# Patient Record
Sex: Male | Born: 1945 | Race: White | Hispanic: No | Marital: Married | Smoking: Former smoker
Health system: Southern US, Community
[De-identification: ages and names within clinical notes are randomized; demographics above are authoritative.]

## PROBLEM LIST (undated history)

## (undated) DIAGNOSIS — N400 Enlarged prostate without lower urinary tract symptoms: Secondary | ICD-10-CM

## (undated) DIAGNOSIS — I10 Essential (primary) hypertension: Secondary | ICD-10-CM

## (undated) DIAGNOSIS — E119 Type 2 diabetes mellitus without complications: Secondary | ICD-10-CM

## (undated) DIAGNOSIS — M1990 Unspecified osteoarthritis, unspecified site: Secondary | ICD-10-CM

## (undated) DIAGNOSIS — Z972 Presence of dental prosthetic device (complete) (partial): Secondary | ICD-10-CM

## (undated) HISTORY — PX: OTHER SURGICAL HISTORY: SHX169

## (undated) HISTORY — DX: Type 2 diabetes mellitus without complications: E11.9

## (undated) HISTORY — DX: Essential (primary) hypertension: I10

## (undated) HISTORY — PX: MOUTH SURGERY: SHX715

## (undated) HISTORY — PX: COLONOSCOPY: SHX174

---

## 2015-03-25 ENCOUNTER — Other Ambulatory Visit: Payer: Self-pay | Admitting: Infectious Diseases

## 2015-03-25 DIAGNOSIS — R109 Unspecified abdominal pain: Secondary | ICD-10-CM

## 2015-03-29 ENCOUNTER — Ambulatory Visit
Admission: RE | Admit: 2015-03-29 | Discharge: 2015-03-29 | Disposition: A | Discharge status: Home / Self Care | Payer: PPO | Source: Ambulatory Visit | Attending: Infectious Diseases | Admitting: Infectious Diseases

## 2015-03-29 DIAGNOSIS — R161 Splenomegaly, not elsewhere classified: Secondary | ICD-10-CM | POA: Insufficient documentation

## 2015-03-29 DIAGNOSIS — R109 Unspecified abdominal pain: Secondary | ICD-10-CM | POA: Diagnosis not present

## 2015-03-29 DIAGNOSIS — N400 Enlarged prostate without lower urinary tract symptoms: Secondary | ICD-10-CM | POA: Insufficient documentation

## 2015-03-29 DIAGNOSIS — R809 Proteinuria, unspecified: Secondary | ICD-10-CM | POA: Insufficient documentation

## 2015-10-27 DIAGNOSIS — E1165 Type 2 diabetes mellitus with hyperglycemia: Secondary | ICD-10-CM | POA: Diagnosis not present

## 2015-10-27 DIAGNOSIS — R972 Elevated prostate specific antigen [PSA]: Secondary | ICD-10-CM | POA: Diagnosis not present

## 2015-10-27 DIAGNOSIS — E1129 Type 2 diabetes mellitus with other diabetic kidney complication: Secondary | ICD-10-CM | POA: Diagnosis not present

## 2015-10-27 DIAGNOSIS — R809 Proteinuria, unspecified: Secondary | ICD-10-CM | POA: Diagnosis not present

## 2015-10-27 DIAGNOSIS — E7800 Pure hypercholesterolemia, unspecified: Secondary | ICD-10-CM | POA: Diagnosis not present

## 2015-10-27 DIAGNOSIS — R109 Unspecified abdominal pain: Secondary | ICD-10-CM | POA: Diagnosis not present

## 2015-11-03 DIAGNOSIS — R809 Proteinuria, unspecified: Secondary | ICD-10-CM | POA: Diagnosis not present

## 2015-11-03 DIAGNOSIS — E7800 Pure hypercholesterolemia, unspecified: Secondary | ICD-10-CM | POA: Diagnosis not present

## 2015-11-03 DIAGNOSIS — R972 Elevated prostate specific antigen [PSA]: Secondary | ICD-10-CM | POA: Diagnosis not present

## 2015-11-03 DIAGNOSIS — E1129 Type 2 diabetes mellitus with other diabetic kidney complication: Secondary | ICD-10-CM | POA: Diagnosis not present

## 2016-01-18 DIAGNOSIS — H35371 Puckering of macula, right eye: Secondary | ICD-10-CM | POA: Diagnosis not present

## 2016-03-02 DIAGNOSIS — R972 Elevated prostate specific antigen [PSA]: Secondary | ICD-10-CM | POA: Diagnosis not present

## 2016-03-02 DIAGNOSIS — E7800 Pure hypercholesterolemia, unspecified: Secondary | ICD-10-CM | POA: Diagnosis not present

## 2016-03-02 DIAGNOSIS — R809 Proteinuria, unspecified: Secondary | ICD-10-CM | POA: Diagnosis not present

## 2016-03-02 DIAGNOSIS — E1129 Type 2 diabetes mellitus with other diabetic kidney complication: Secondary | ICD-10-CM | POA: Diagnosis not present

## 2016-03-07 DIAGNOSIS — E7800 Pure hypercholesterolemia, unspecified: Secondary | ICD-10-CM | POA: Diagnosis not present

## 2016-03-07 DIAGNOSIS — R972 Elevated prostate specific antigen [PSA]: Secondary | ICD-10-CM | POA: Diagnosis not present

## 2016-03-07 DIAGNOSIS — R809 Proteinuria, unspecified: Secondary | ICD-10-CM | POA: Diagnosis not present

## 2016-03-07 DIAGNOSIS — E1129 Type 2 diabetes mellitus with other diabetic kidney complication: Secondary | ICD-10-CM | POA: Diagnosis not present

## 2016-06-28 DIAGNOSIS — R972 Elevated prostate specific antigen [PSA]: Secondary | ICD-10-CM | POA: Diagnosis not present

## 2016-06-28 DIAGNOSIS — R809 Proteinuria, unspecified: Secondary | ICD-10-CM | POA: Diagnosis not present

## 2016-06-28 DIAGNOSIS — E7800 Pure hypercholesterolemia, unspecified: Secondary | ICD-10-CM | POA: Diagnosis not present

## 2016-06-28 DIAGNOSIS — E1129 Type 2 diabetes mellitus with other diabetic kidney complication: Secondary | ICD-10-CM | POA: Diagnosis not present

## 2016-07-07 DIAGNOSIS — Z1159 Encounter for screening for other viral diseases: Secondary | ICD-10-CM | POA: Diagnosis not present

## 2016-07-07 DIAGNOSIS — E1129 Type 2 diabetes mellitus with other diabetic kidney complication: Secondary | ICD-10-CM | POA: Diagnosis not present

## 2016-07-07 DIAGNOSIS — R809 Proteinuria, unspecified: Secondary | ICD-10-CM | POA: Diagnosis not present

## 2016-07-07 DIAGNOSIS — Z0000 Encounter for general adult medical examination without abnormal findings: Secondary | ICD-10-CM | POA: Diagnosis not present

## 2016-07-07 DIAGNOSIS — E7800 Pure hypercholesterolemia, unspecified: Secondary | ICD-10-CM | POA: Diagnosis not present

## 2016-07-07 DIAGNOSIS — R972 Elevated prostate specific antigen [PSA]: Secondary | ICD-10-CM | POA: Diagnosis not present

## 2016-11-06 DIAGNOSIS — H35371 Puckering of macula, right eye: Secondary | ICD-10-CM | POA: Diagnosis not present

## 2016-12-25 DIAGNOSIS — R339 Retention of urine, unspecified: Secondary | ICD-10-CM | POA: Diagnosis not present

## 2016-12-25 DIAGNOSIS — R809 Proteinuria, unspecified: Secondary | ICD-10-CM | POA: Diagnosis not present

## 2016-12-25 DIAGNOSIS — E1129 Type 2 diabetes mellitus with other diabetic kidney complication: Secondary | ICD-10-CM | POA: Diagnosis not present

## 2016-12-25 DIAGNOSIS — R350 Frequency of micturition: Secondary | ICD-10-CM | POA: Diagnosis not present

## 2016-12-26 DIAGNOSIS — R350 Frequency of micturition: Secondary | ICD-10-CM | POA: Diagnosis not present

## 2016-12-27 ENCOUNTER — Encounter: Payer: Self-pay | Admitting: *Deleted

## 2016-12-27 ENCOUNTER — Emergency Department
Admission: EM | Admit: 2016-12-27 | Discharge: 2016-12-28 | Disposition: A | Discharge status: Home / Self Care | Payer: PPO | Attending: Emergency Medicine | Admitting: Emergency Medicine

## 2016-12-27 DIAGNOSIS — Z79899 Other long term (current) drug therapy: Secondary | ICD-10-CM | POA: Diagnosis not present

## 2016-12-27 DIAGNOSIS — Z7984 Long term (current) use of oral hypoglycemic drugs: Secondary | ICD-10-CM | POA: Insufficient documentation

## 2016-12-27 DIAGNOSIS — R339 Retention of urine, unspecified: Secondary | ICD-10-CM | POA: Insufficient documentation

## 2016-12-27 DIAGNOSIS — Z7982 Long term (current) use of aspirin: Secondary | ICD-10-CM | POA: Insufficient documentation

## 2016-12-27 LAB — CBC
HCT: 38.5 % — ABNORMAL LOW (ref 40.0–52.0)
HEMOGLOBIN: 13 g/dL (ref 13.0–18.0)
MCH: 26.1 pg (ref 26.0–34.0)
MCHC: 33.8 g/dL (ref 32.0–36.0)
MCV: 77.2 fL — ABNORMAL LOW (ref 80.0–100.0)
Platelets: 178 10*3/uL (ref 150–440)
RBC: 4.99 MIL/uL (ref 4.40–5.90)
RDW: 13.5 % (ref 11.5–14.5)
WBC: 7.7 10*3/uL (ref 3.8–10.6)

## 2016-12-27 LAB — COMPREHENSIVE METABOLIC PANEL
ALBUMIN: 4.4 g/dL (ref 3.5–5.0)
ALK PHOS: 70 U/L (ref 38–126)
ALT: 27 U/L (ref 17–63)
AST: 32 U/L (ref 15–41)
Anion gap: 8 (ref 5–15)
BUN: 20 mg/dL (ref 6–20)
CHLORIDE: 98 mmol/L — AB (ref 101–111)
CO2: 27 mmol/L (ref 22–32)
CREATININE: 0.94 mg/dL (ref 0.61–1.24)
Calcium: 9.6 mg/dL (ref 8.9–10.3)
GFR calc non Af Amer: >60 mL/min (ref >60)
GLUCOSE: 173 mg/dL — AB (ref 65–99)
Potassium: 3.7 mmol/L (ref 3.5–5.1)
SODIUM: 133 mmol/L — AB (ref 135–145)
Total Bilirubin: 1.8 mg/dL — ABNORMAL HIGH (ref 0.3–1.2)
Total Protein: 7.7 g/dL (ref 6.5–8.1)

## 2016-12-27 LAB — URINALYSIS, COMPLETE (UACMP) WITH MICROSCOPIC
BACTERIA UA: NONE SEEN
BILIRUBIN URINE: NEGATIVE
Glucose, UA: NEGATIVE mg/dL
KETONES UR: NEGATIVE mg/dL
Leukocytes, UA: NEGATIVE
Nitrite: NEGATIVE
PH: 5.5 (ref 5.0–8.0)
PROTEIN: NEGATIVE mg/dL
Specific Gravity, Urine: 1.015 (ref 1.005–1.030)
Squamous Epithelial / HPF: NONE SEEN

## 2016-12-27 MED ORDER — LIDOCAINE HCL 2 % EX GEL
CUTANEOUS | Status: AC
  Administered 2016-12-27: 1 via URETHRAL
  Filled 2016-12-27: qty 10

## 2016-12-27 MED ORDER — LIDOCAINE HCL 2 % EX GEL
1.0000 "application " | Freq: Once | CUTANEOUS | Status: AC
  Administered 2016-12-27: 1 via URETHRAL

## 2016-12-27 NOTE — ED Triage Notes (Signed)
Pt reports he has not voided since 1500 today. Pt states he saw [facility] pa-c 3 days ago and was started on flomax.  States not any better.  Pt alert.

## 2016-12-27 NOTE — Discharge Instructions (Signed)
Please call the number provided for urology to arrange a follow-up appointment in approximately 10 days. Return to the emergency department for any fever, nausea vomiting, if your catheter fails to drain urine for greater than 12 hours, or any other symptom personally concerning to yourself.

## 2016-12-27 NOTE — ED Notes (Signed)
Bladder Scan Completed: >986ml / EDP notified of results.

## 2016-12-27 NOTE — ED Provider Notes (Signed)
[facility] Emergency Department Provider Note  Time seen: 10:43 PM  I have reviewed the triage vital signs and the nursing notes.   HISTORY  Chief Complaint Urinary Retention    HPI Lawrence Hart is a 71 y.o. male with a past medical history of BPH, hyperlipidemia, diabetes, presents to the emergency departmentfor lower abdominal pain/distention and unable to urinate. Patient states for the last 4-5 days he has been having discomfort with attempted urination which she describes as a burning sensation in the penis and not being able to fully empty his bladder. He saw his primary care doctor 3 days ago had a urine sent was started on antibiotic as well as Flomax. Patient states he has not been able to urinate since 3 PM today. Denies any fever, nausea, vomiting. States intermittent lower abdominal pain/fullness. States the pain comes in spasms. Patient has greater than 1 L of urine on bladder scan.  No past medical history on file.  There are no active problems to display for this patient.   No past surgical history on file.  Prior to Admission medications   Medication Sig Start Date End Date Taking? Authorizing Provider  aspirin EC 81 MG tablet Take 81 mg by mouth daily.   Yes [provider]  atorvastatin (LIPITOR) 10 MG tablet Take 10 mg by mouth every evening.   Yes [provider]  cefUROXime (CEFTIN) 500 MG tablet Take 500 mg by mouth 2 (two) times daily. for 10 days   Yes [provider]  metFORMIN (GLUCOPHAGE) 500 MG tablet Take 500 mg by mouth 2 (two) times daily.   Yes [provider]  tamsulosin (FLOMAX) 0.4 MG CAPS capsule Take 0.4 mg by mouth daily.   Yes [provider]    No Known Allergies  No family history on file.  Social History Social History  Substance Use Topics  . Smoking status: Never Smoker  . Smokeless tobacco: Never Used  . Alcohol use Yes    Review of  Systems Constitutional: Negative for fever. Cardiovascular: Negative for chest pain. Respiratory: Negative for shortness of breath. Gastrointestinal:Lower abdominal pain/fullness. Negative for nausea vomiting or diarrhea Genitourinary: Burning with urination, now not able to urinate 8 hours. Musculoskeletal: Negative for back pain Neurological: Negative for headache All other ROS negative  ____________________________________________   PHYSICAL EXAM:  VITAL SIGNS: ED Triage Vitals  Enc Vitals Group     BP 12/27/16 2214 (!) 188/90     Pulse Rate 12/27/16 2214 100     Resp 12/27/16 2214 20     Temp 12/27/16 2214 98.6 F (37 C)     Temp Source 12/27/16 2214 Oral     SpO2 12/27/16 2214 100 %     Weight 12/27/16 2215 188 lb (85.3 kg)     Height 12/27/16 2215 6' (1.829 m)     Head Circumference --      Peak Flow --      Pain Score 12/27/16 2212 5     Pain Loc --      Pain Edu? --      Excl. in [place]? --     Constitutional: Alert and oriented. Well appearing and in no distress. Eyes: Normal exam ENT   Head: Normocephalic and atraumatic   Mouth/Throat: Mucous membranes are moist. Cardiovascular: Normal rate, regular rhythm. No murmur Respiratory: Normal respiratory effort without tachypnea nor retractions. Breath sounds are clear  Gastrointestinal: Soft, suprapubic tenderness and fullness. Abdomen otherwise benign. Normal external  GU exam. Musculoskeletal: Nontender with normal range of motion in all extremities.  Neurologic:  Normal speech and language. No gross focal neurologic deficits  Skin:  Skin is warm, dry and intact.  Psychiatric: Mood and affect are normal.   ____________________________________________   INITIAL IMPRESSION / ASSESSMENT AND PLAN / ED COURSE  Pertinent labs & imaging results that were available during my care of the patient were reviewed by me and considered in my medical decision making (see chart for details).  Patient presents to the  emergency department with urinary retention unable to urinate since 3 PM today. I reviewed the patient's urinalysis from several days ago which showed some red cells but otherwise looks fairly normal. The urine culture has not grown any bacteria. Greater than 1 L of urine on bladder scan. Unable to pass a Foley catheter. Discussed with Dr. Erlene Quan of urology we will attempt to pass a coud catheter.  Daily catheter inserted with 1600 cc of urine drained. We will send a urinalysis as well as a urine culture. We'll check basic labs. Patient will follow-up with urology in 7-14 days. Patient agreeable with this plan.  Labs are largely within normal limits. A urine culture has been sent. We will send home with a leg bag and urology follow-up.    ____________________________________________   FINAL CLINICAL IMPRESSION(S) / ED DIAGNOSES  Urinary retention    Harvest Dark, MD 12/27/16 2356

## 2016-12-28 NOTE — ED Notes (Addendum)
Patient discharge and follow up information reviewed with patient by ED nursing staff and patient given the opportunity to ask questions pertaining to ED visit and discharge plan of care. Patient advised that should symptoms not continue to improve, resolve entirely, or should new symptoms develop then a follow up visit with their PCP or a return visit to the ED may be warranted. Patient verbalized consent and understanding of discharge plan of care including potential need for further evaluation. Patient being discharged in stable condition per attending ED physician on duty.   Pt given visual instruction for leg bag care as well as pamphlet for urine drainage bag care. No further questions at this time.

## 2016-12-29 LAB — URINE CULTURE: CULTURE: NO GROWTH

## 2017-01-02 DIAGNOSIS — Z1159 Encounter for screening for other viral diseases: Secondary | ICD-10-CM | POA: Diagnosis not present

## 2017-01-02 DIAGNOSIS — E1129 Type 2 diabetes mellitus with other diabetic kidney complication: Secondary | ICD-10-CM | POA: Diagnosis not present

## 2017-01-02 DIAGNOSIS — R809 Proteinuria, unspecified: Secondary | ICD-10-CM | POA: Diagnosis not present

## 2017-01-02 DIAGNOSIS — E7800 Pure hypercholesterolemia, unspecified: Secondary | ICD-10-CM | POA: Diagnosis not present

## 2017-01-08 ENCOUNTER — Ambulatory Visit (INDEPENDENT_AMBULATORY_CARE_PROVIDER_SITE_OTHER): Payer: PPO | Admitting: Urology

## 2017-01-08 ENCOUNTER — Encounter: Payer: Self-pay | Admitting: Urology

## 2017-01-08 VITALS — BP 162/67 | HR 76 | Ht 72.0 in | Wt 193.7 lb

## 2017-01-08 DIAGNOSIS — R339 Retention of urine, unspecified: Secondary | ICD-10-CM | POA: Diagnosis not present

## 2017-01-08 MED ORDER — TAMSULOSIN HCL 0.4 MG PO CAPS: 0.8000 mg | ORAL_CAPSULE | Freq: Every day | ORAL | 11 refills | Status: DC

## 2017-01-08 NOTE — Progress Notes (Signed)
Cath Change/ Replacement  Patient is present today for a catheter change due to urinary retention.  Patient was cleaned and prepped in a sterile fashion with betadine and 2% lidocaine jelly was instilled into the urethra. A 16 FR Coude foley cath was replaced into the bladder no complications were noted Urine return was noted 874ml and urine was yellow in color. The balloon was filled with 53ml of sterile water. A leg bag was attached for drainage.  A night bag was also given to the patient and patient was given instruction on how to change from one bag to another. Patient was given proper instruction on catheter care.    Preformed by: Elberta Leatherwood, CMA  Follow up: 1 week for fill and pull

## 2017-01-08 NOTE — Progress Notes (Signed)
01/08/2017 8:45 AM   Lawrence Hart June 09, 1946 [id number]  Referring provider: Leonel Ramsay, MD [place], [place] [address]  Chief Complaint  Patient presents with  . Urinary Retention    HPI: The patient went to the emergency room and was scanned for 1 L. A few days prior he had started Flomax. The patient was catheterized for 1600 mL. Urine culture was negative  The patient normally has a reasonable flow. Without a precipitating cause the retention came on quite suddenly. He thinks he has loose bowel movements perhaps from his current antibiotic. He normally feels empty.  He is on oral hypoglycemics. He does not get bladder infections. He has not had genitourinary surgery  Modifying factors: There are no other modifying factors  Associated signs and symptoms: There are no other associated signs and symptoms Aggravating and relieving factors: There are no other aggravating or relieving factors Severity: Moderate Duration: Persistent   PMH: Past Medical History:  Diagnosis Date  . Diabetes mellitus without complication ([place])   . Hypertension     Surgical History: History reviewed. No pertinent surgical history.  Home Medications:  Allergies as of 01/08/2017   No Known Allergies     Medication List       Accurate as of 01/08/17  8:45 AM. Always use your most recent med list.          aspirin EC 81 MG tablet Take 81 mg by mouth daily.   atorvastatin 10 MG tablet Commonly known as:  LIPITOR Take 10 mg by mouth every evening.   cefUROXime 500 MG tablet Commonly known as:  CEFTIN Take 500 mg by mouth 2 (two) times daily. for 10 days   losartan 25 MG tablet Commonly known as:  COZAAR Take 25 mg by mouth daily.   metFORMIN 500 MG tablet Commonly known as:  GLUCOPHAGE Take 500 mg by mouth 2 (two) times daily.   tamsulosin 0.4 MG Caps capsule Commonly known as:  FLOMAX Take 0.4 mg by mouth daily.       Allergies: No Known  Allergies  Family History: Family History  Problem Relation Age of Onset  . Kidney disease Mother   . Prostate cancer Neg Hx   . Bladder Cancer Neg Hx   . Kidney cancer Neg Hx     Social History:  reports that he has never smoked. He has never used smokeless tobacco. He reports that he drinks alcohol. He reports that he does not use drugs.  ROS: UROLOGY Frequent Urination?: No Hard to postpone urination?: No Burning/pain with urination?: No Get up at night to urinate?: Yes Leakage of urine?: No Urine stream starts and stops?: No Trouble starting stream?: No Do you have to strain to urinate?: No Blood in urine?: No Urinary tract infection?: No Sexually transmitted disease?: No Injury to kidneys or bladder?: No Painful intercourse?: No Weak stream?: No Erection problems?: Yes Penile pain?: No  Gastrointestinal Nausea?: No Vomiting?: No Indigestion/heartburn?: No Diarrhea?: Yes Constipation?: No  Constitutional Fever: No Night sweats?: No Weight loss?: No Fatigue?: No  Skin Skin rash/lesions?: Yes Itching?: Yes  Eyes Blurred vision?: No Double vision?: No  Ears/Nose/Throat Sore throat?: No Sinus problems?: No  Hematologic/Lymphatic Swollen glands?: No Easy bruising?: No  Cardiovascular Leg swelling?: No Chest pain?: No  Respiratory Cough?: No Shortness of breath?: No  Endocrine Excessive thirst?: No  Musculoskeletal Back pain?: Yes Joint pain?: Yes  Neurological Headaches?: No Dizziness?: No  Psychologic Depression?: No Anxiety?: No  Physical Exam: BP (!) 162/67 (BP Location: Left Arm, Patient Position: Sitting, Cuff Size: Normal)   Pulse 76   Ht 6' (1.829 m)   Wt 193 lb 11.2 oz (87.9 kg)   BMI 26.27 kg/m   Constitutional:  Alert and oriented, No acute distress. HEENT: [place] AT, moist mucus membranes.  Trachea midline, no masses. Cardiovascular: No clubbing, cyanosis, or edema. Respiratory: Normal respiratory effort, no increased  work of breathing. GI: Abdomen is soft, nontender, nondistended, no abdominal masses GU: Normal male genitalia; urethral catheter in place; 60 g prostate or larger but it felt benign Skin: No rashes, bruises or suspicious lesions. Lymph: No cervical or inguinal adenopathy. Neurologic: Grossly intact, no focal deficits, moving all 4 extremities. Psychiatric: Normal mood and affect.  Laboratory Data: Lab Results  Component Value Date   WBC 7.7 12/27/2016   HGB 13.0 12/27/2016   HCT 38.5 (L) 12/27/2016   MCV 77.2 (L) 12/27/2016   PLT 178 12/27/2016    Lab Results  Component Value Date   CREATININE 0.94 12/27/2016    No results found for: PSA  No results found for: TESTOSTERONE  No results found for: HGBA1C  Urinalysis    Component Value Date/Time   COLORURINE YELLOW 12/27/2016 2306   APPEARANCEUR CLEAR 12/27/2016 2306   LABSPEC 1.015 12/27/2016 2306   PHURINE 5.5 12/27/2016 2306   GLUCOSEU NEGATIVE 12/27/2016 2306   HGBUR MODERATE (A) 12/27/2016 2306   BILIRUBINUR NEGATIVE 12/27/2016 2306   KETONESUR NEGATIVE 12/27/2016 2306   PROTEINUR NEGATIVE 12/27/2016 2306   NITRITE NEGATIVE 12/27/2016 2306   LEUKOCYTESUR NEGATIVE 12/27/2016 2306    Pertinent Imaging: None  Assessment & Plan:  The patient had high-volume acute urinary retention. Pathophysiology was discussed. Voiding trial was discussed. He had a normal renal ultrasound in September 2016  NOTE: The patient came back and only could void small amounts and was scanned for approximately 900 mL. Utilizing sterile technique with a well-lubricated catheter the catheter was inserted. He will have another trial of voiding on Flomax 0.8 mg. Depending on his progress he may need cystoscopy and/or urodynamics in the future to assess for a possible prostatectomy. I think the urodynamics would be very useful especially with his large capacity bladder noted above  He does have an impressive hemorrhoid but it is not painful. He  will have another trial of voiding next Monday.    There are no diagnoses linked to this encounter.  No Follow-up on file.  Reece Packer, MD  [facility] [address], [place], [place] [address] [phone]

## 2017-01-08 NOTE — Progress Notes (Signed)
Fill and Pull Catheter Removal  Patient is present today for a catheter removal.  Patient was cleaned and prepped in a sterile fashion 429ml of sterile water/ saline was instilled into the bladder when the patient felt the urge to urinate. 86ml of water was then drained from the balloon.  A 16FR foley cath was removed from the bladder no complications were noted .  Patient as then given some time to void on their own.  Patient cannot void on their own after some time.  Patient tolerated well.  Preformed by: Elberta Leatherwood, CMA  Follow up/ Additional notes: Patient is to return today at 3:00pm

## 2017-01-09 ENCOUNTER — Telehealth: Payer: Self-pay

## 2017-01-09 ENCOUNTER — Ambulatory Visit (INDEPENDENT_AMBULATORY_CARE_PROVIDER_SITE_OTHER): Payer: PPO

## 2017-01-09 VITALS — BP 133/73 | HR 94 | Ht 72.0 in | Wt 191.1 lb

## 2017-01-09 DIAGNOSIS — R339 Retention of urine, unspecified: Secondary | ICD-10-CM | POA: Diagnosis not present

## 2017-01-09 DIAGNOSIS — E1129 Type 2 diabetes mellitus with other diabetic kidney complication: Secondary | ICD-10-CM | POA: Diagnosis not present

## 2017-01-09 DIAGNOSIS — R972 Elevated prostate specific antigen [PSA]: Secondary | ICD-10-CM | POA: Diagnosis not present

## 2017-01-09 DIAGNOSIS — N401 Enlarged prostate with lower urinary tract symptoms: Secondary | ICD-10-CM | POA: Diagnosis not present

## 2017-01-09 DIAGNOSIS — R338 Other retention of urine: Secondary | ICD-10-CM | POA: Diagnosis not present

## 2017-01-09 DIAGNOSIS — R197 Diarrhea, unspecified: Secondary | ICD-10-CM | POA: Diagnosis not present

## 2017-01-09 DIAGNOSIS — R809 Proteinuria, unspecified: Secondary | ICD-10-CM | POA: Diagnosis not present

## 2017-01-09 NOTE — Progress Notes (Signed)
Bladder Irrigation  Due to blood seen in leg beg patient is present today for a bladder irrigation. Patient was cleaned and prepped in a sterile fashion. 1000 ml of saline/sterile water was instilled and irrigated into the bladder with a 48ml [place] syringe through the catheter in place.  After irrigation urine flow was noted no complications were noted catheter is now draining fine.  Catheter was reattached to the leg bag for drainage. Patient tolerated well.   Performed by: C. Corinna Capra, CMA

## 2017-01-09 NOTE — Telephone Encounter (Signed)
Patient called in stating after walking around for doctor's appt's today there is dark,  thick blood in his catheter w/ clots. Says catheter appears to be draining. Advised pt ok to come in today for nurse visit to be checked. Patient agreed. Scheduled appt.

## 2017-01-11 ENCOUNTER — Emergency Department
Admission: EM | Admit: 2017-01-11 | Discharge: 2017-01-11 | Disposition: A | Discharge status: Home / Self Care | Payer: PPO | Attending: Emergency Medicine | Admitting: Emergency Medicine

## 2017-01-11 ENCOUNTER — Ambulatory Visit: Payer: PPO

## 2017-01-11 ENCOUNTER — Encounter: Payer: Self-pay | Admitting: *Deleted

## 2017-01-11 DIAGNOSIS — Z79899 Other long term (current) drug therapy: Secondary | ICD-10-CM | POA: Diagnosis not present

## 2017-01-11 DIAGNOSIS — R319 Hematuria, unspecified: Secondary | ICD-10-CM | POA: Insufficient documentation

## 2017-01-11 DIAGNOSIS — Z7984 Long term (current) use of oral hypoglycemic drugs: Secondary | ICD-10-CM | POA: Insufficient documentation

## 2017-01-11 DIAGNOSIS — Z7982 Long term (current) use of aspirin: Secondary | ICD-10-CM | POA: Insufficient documentation

## 2017-01-11 DIAGNOSIS — E119 Type 2 diabetes mellitus without complications: Secondary | ICD-10-CM | POA: Insufficient documentation

## 2017-01-11 DIAGNOSIS — I10 Essential (primary) hypertension: Secondary | ICD-10-CM | POA: Insufficient documentation

## 2017-01-11 LAB — CBC WITH DIFFERENTIAL/PLATELET
BASOS ABS: 0.1 10*3/uL (ref 0–0.1)
BASOS PCT: 1 %
EOS ABS: 0.3 10*3/uL (ref 0–0.7)
EOS PCT: 3 %
HCT: 39.1 % — ABNORMAL LOW (ref 40.0–52.0)
Hemoglobin: 13.1 g/dL (ref 13.0–18.0)
LYMPHS PCT: 12 %
Lymphs Abs: 1.2 10*3/uL (ref 1.0–3.6)
MCH: 26.1 pg (ref 26.0–34.0)
MCHC: 33.5 g/dL (ref 32.0–36.0)
MCV: 77.8 fL — AB (ref 80.0–100.0)
Monocytes Absolute: 0.8 10*3/uL (ref 0.2–1.0)
Monocytes Relative: 7 %
Neutro Abs: 8 10*3/uL — ABNORMAL HIGH (ref 1.4–6.5)
Neutrophils Relative %: 77 %
PLATELETS: 184 10*3/uL (ref 150–440)
RBC: 5.03 MIL/uL (ref 4.40–5.90)
RDW: 13.4 % (ref 11.5–14.5)
WBC: 10.4 10*3/uL (ref 3.8–10.6)

## 2017-01-11 LAB — URINALYSIS, COMPLETE (UACMP) WITH MICROSCOPIC
Specific Gravity, Urine: 1.006 (ref 1.005–1.030)
Squamous Epithelial / LPF: NONE SEEN

## 2017-01-11 LAB — BASIC METABOLIC PANEL
ANION GAP: 10 (ref 5–15)
BUN: 19 mg/dL (ref 6–20)
CALCIUM: 9.3 mg/dL (ref 8.9–10.3)
CO2: 25 mmol/L (ref 22–32)
Chloride: 100 mmol/L — ABNORMAL LOW (ref 101–111)
Creatinine, Ser: 0.76 mg/dL (ref 0.61–1.24)
GLUCOSE: 178 mg/dL — AB (ref 65–99)
POTASSIUM: 3.9 mmol/L (ref 3.5–5.1)
SODIUM: 135 mmol/L (ref 135–145)

## 2017-01-11 MED ORDER — CIPROFLOXACIN HCL 500 MG PO TABS: 500.0000 mg | ORAL_TABLET | Freq: Two times a day (BID) | ORAL | 0 refills | Status: DC

## 2017-01-11 NOTE — Discharge Instructions (Signed)
U also has some white blood cells in your urine which may mean it bladder infection. Give you some Cipro antibiotic one twice a day. Please call your urologist tomorrow and he should be on CDU again. Please return here if the catheter clots off again we can repeat irrigate it and open it up again.

## 2017-01-11 NOTE — ED Notes (Signed)
Patient irrigated with 891mL fluid and output was 1349mL.  Urine was bloody with clots.

## 2017-01-11 NOTE — ED Triage Notes (Signed)
Patient reports hematuria and blood clots in urine via leg bag from catheter. Patient had catheter removed and replaced on Monday 7/2 after being unable to void.

## 2017-01-11 NOTE — ED Notes (Signed)
Per EDP patient is currently not draining. Bladder scan patient and see what he has in his bladder. Patient may have to have CBI.

## 2017-01-11 NOTE — ED Provider Notes (Signed)
[facility] Emergency Department Provider Note   ____________________________________________   First MD Initiated Contact with Patient 01/11/17 1902     (approximate)  I have reviewed the triage vital signs and the nursing notes.   HISTORY  Chief Complaint Hematuria    HPI Lawrence Hart is a 71 y.o. male who had to have a Foley catheter placed for inability to urinate about a month ago he went to the urologist had it removed and could not urinate and have it replaced. The last several days he's been having a lot of blood in the urine and some days the urine will be dark red other days light pain can sometimes is clear. Today the catheter has been clogging up when he's been leaking around the catheter and having some mild bladder spasms. It appears as though the catheter might have clotted shot. We will attempt to irrigate it.   Past Medical History:  Diagnosis Date  . Diabetes mellitus without complication ([place])   . Foley catheter in place   . Hypertension     There are no active problems to display for this patient.   History reviewed. No pertinent surgical history.  Prior to Admission medications   Medication Sig Start Date End Date Taking? Authorizing Provider  aspirin EC 81 MG tablet Take 81 mg by mouth daily.    [provider]  atorvastatin (LIPITOR) 10 MG tablet Take 10 mg by mouth every evening.    [provider]  cefUROXime (CEFTIN) 500 MG tablet Take 500 mg by mouth 2 (two) times daily. for 10 days    [provider]  losartan (COZAAR) 25 MG tablet Take 25 mg by mouth daily.    [provider]  metFORMIN (GLUCOPHAGE) 500 MG tablet Take 500 mg by mouth 2 (two) times daily.    [provider]  tamsulosin (FLOMAX) 0.4 MG CAPS capsule Take 2 capsules (0.8 mg total) by mouth daily. 01/08/17   Bjorn Loser, MD    Allergies Patient has no known allergies.  Family History  Problem  Relation Age of Onset  . Kidney disease Mother   . Prostate cancer Neg Hx   . Bladder Cancer Neg Hx   . Kidney cancer Neg Hx     Social History Social History  Substance Use Topics  . Smoking status: Never Smoker  . Smokeless tobacco: Never Used  . Alcohol use Yes    Review of Systems  Constitutional: No fever/chills Eyes: No visual changes. ENT: No sore throat. Cardiovascular: Denies chest pain. Respiratory: Denies shortness of breath. Gastrointestinal: No abdominal pain.  No nausea, no vomiting.  No diarrhea.  No constipation. Genitourinary: See history of present illness Musculoskeletal: Negative for back pain. Skin: Negative for rash. Neurological: Negative for headaches, focal weakness or numbness.   ____________________________________________   PHYSICAL EXAM:  VITAL SIGNS: ED Triage Vitals  Enc Vitals Group     BP 01/11/17 1852 135/77     Pulse Rate 01/11/17 1852 94     Resp 01/11/17 1852 18     Temp 01/11/17 1852 98 F (36.7 C)     Temp Source 01/11/17 1852 Oral     SpO2 01/11/17 1852 97 %     Weight 01/11/17 1854 190 lb (86.2 kg)     Height 01/11/17 1854 6' (1.829 m)     Head Circumference --      Peak Flow --      Pain Score --  Pain Loc --      Pain Edu? --      Excl. in [place]? --     Constitutional: Alert and oriented. Well appearing and in no acute distress. Eyes: Conjunctivae are normal. PERRL. EOMI. Head: Atraumatic. Nose: No congestion/rhinnorhea. Mouth/Throat: Mucous membranes are moist.   Neck: No stridor.  Cardiovascular: Normal rate, regular rhythm. Grossly normal heart sounds.  Good peripheral circulation. Respiratory: Normal respiratory effort.  No retractions. Lungs CTAB. Gastrointestinal: Soft and nontender. No distention. No abdominal bruits. No CVA tenderness. Genitourinary: Foley catheter in place. Dark red urine small amount in the bag. Musculoskeletal: No lower extremity tenderness nor edema.  No joint  effusions. Neurologic:  Normal speech and language. No gross focal neurologic deficits are appreciated. No gait instability. Skin:  Skin is warm, dry and intact. No rash noted. Psychiatric: Mood and affect are normal. Speech and behavior are normal.  ____________________________________________   LABS (all labs ordered are listed, but only abnormal results are displayed)  Labs Reviewed  URINALYSIS, COMPLETE (UACMP) WITH MICROSCOPIC - Abnormal; Notable for the following:       Result Value   Color, Urine RED (*)    APPearance CLOUDY (*)    Glucose, UA   (*)    Value: TEST NOT REPORTED DUE TO COLOR INTERFERENCE OF URINE PIGMENT   Hgb urine dipstick   (*)    Value: TEST NOT REPORTED DUE TO COLOR INTERFERENCE OF URINE PIGMENT   Bilirubin Urine   (*)    Value: TEST NOT REPORTED DUE TO COLOR INTERFERENCE OF URINE PIGMENT   Ketones, ur   (*)    Value: TEST NOT REPORTED DUE TO COLOR INTERFERENCE OF URINE PIGMENT   Protein, ur   (*)    Value: TEST NOT REPORTED DUE TO COLOR INTERFERENCE OF URINE PIGMENT   Nitrite   (*)    Value: TEST NOT REPORTED DUE TO COLOR INTERFERENCE OF URINE PIGMENT   Leukocytes, UA   (*)    Value: TEST NOT REPORTED DUE TO COLOR INTERFERENCE OF URINE PIGMENT   Bacteria, UA MANY (*)    All other components within normal limits  CBC WITH DIFFERENTIAL/PLATELET - Abnormal; Notable for the following:    HCT 39.1 (*)    MCV 77.8 (*)    Neutro Abs 8.0 (*)    All other components within normal limits  BASIC METABOLIC PANEL - Abnormal; Notable for the following:    Chloride 100 (*)    Glucose, Bld 178 (*)    All other components within normal limits   ____________________________________________  EKG   ____________________________________________  RADIOLOGY   ____________________________________________   PROCEDURES  Procedure(s) performed:  Procedures  Critical Care performed:   ____________________________________________   INITIAL IMPRESSION /  ASSESSMENT AND PLAN / ED COURSE  Pertinent labs & imaging results that were available during my care of the patient were reviewed by me and considered in my medical decision making (see chart for details).  UA now has 6-30 WBCs in it as well as too numerous to count red cells. We'll have the patient some antibiotics have him follow back up with the urologist.      ____________________________________________   FINAL CLINICAL IMPRESSION(S) / ED DIAGNOSES  Final diagnoses:  Hematuria, unspecified type      NEW MEDICATIONS STARTED DURING THIS VISIT:  New Prescriptions   No medications on file     Note:  This document was prepared using Dragon voice recognition software and may include unintentional dictation  errors.    Nena Polio, MD 01/11/17 2043

## 2017-01-12 ENCOUNTER — Observation Stay
Admission: EM | Admit: 2017-01-12 | Discharge: 2017-01-14 | Disposition: A | Discharge status: Home / Self Care | Payer: PPO | Attending: Internal Medicine | Admitting: Internal Medicine

## 2017-01-12 ENCOUNTER — Encounter: Payer: Self-pay | Admitting: Emergency Medicine

## 2017-01-12 ENCOUNTER — Emergency Department: Payer: PPO

## 2017-01-12 DIAGNOSIS — E119 Type 2 diabetes mellitus without complications: Secondary | ICD-10-CM | POA: Diagnosis not present

## 2017-01-12 DIAGNOSIS — R338 Other retention of urine: Secondary | ICD-10-CM | POA: Insufficient documentation

## 2017-01-12 DIAGNOSIS — E785 Hyperlipidemia, unspecified: Secondary | ICD-10-CM | POA: Diagnosis not present

## 2017-01-12 DIAGNOSIS — Y929 Unspecified place or not applicable: Secondary | ICD-10-CM | POA: Diagnosis not present

## 2017-01-12 DIAGNOSIS — N401 Enlarged prostate with lower urinary tract symptoms: Secondary | ICD-10-CM | POA: Insufficient documentation

## 2017-01-12 DIAGNOSIS — Y846 Urinary catheterization as the cause of abnormal reaction of the patient, or of later complication, without mention of misadventure at the time of the procedure: Secondary | ICD-10-CM | POA: Diagnosis not present

## 2017-01-12 DIAGNOSIS — T83091A Other mechanical complication of indwelling urethral catheter, initial encounter: Secondary | ICD-10-CM | POA: Insufficient documentation

## 2017-01-12 DIAGNOSIS — I10 Essential (primary) hypertension: Secondary | ICD-10-CM | POA: Insufficient documentation

## 2017-01-12 DIAGNOSIS — R319 Hematuria, unspecified: Secondary | ICD-10-CM | POA: Diagnosis not present

## 2017-01-12 DIAGNOSIS — Z79899 Other long term (current) drug therapy: Secondary | ICD-10-CM | POA: Diagnosis not present

## 2017-01-12 DIAGNOSIS — T83091D Other mechanical complication of indwelling urethral catheter, subsequent encounter: Secondary | ICD-10-CM | POA: Diagnosis present

## 2017-01-12 DIAGNOSIS — R310 Gross hematuria: Principal | ICD-10-CM | POA: Insufficient documentation

## 2017-01-12 DIAGNOSIS — Z7984 Long term (current) use of oral hypoglycemic drugs: Secondary | ICD-10-CM | POA: Diagnosis not present

## 2017-01-12 DIAGNOSIS — T83098A Other mechanical complication of other indwelling urethral catheter, initial encounter: Secondary | ICD-10-CM | POA: Diagnosis not present

## 2017-01-12 DIAGNOSIS — K8020 Calculus of gallbladder without cholecystitis without obstruction: Secondary | ICD-10-CM | POA: Diagnosis not present

## 2017-01-12 DIAGNOSIS — N400 Enlarged prostate without lower urinary tract symptoms: Secondary | ICD-10-CM | POA: Diagnosis not present

## 2017-01-12 HISTORY — DX: Benign prostatic hyperplasia without lower urinary tract symptoms: N40.0

## 2017-01-12 LAB — CBC WITH DIFFERENTIAL/PLATELET
BASOS ABS: 0.1 10*3/uL (ref 0–0.1)
Basophils Relative: 1 %
EOS ABS: 0.4 10*3/uL (ref 0–0.7)
Eosinophils Relative: 4 %
HCT: 37.3 % — ABNORMAL LOW (ref 40.0–52.0)
Hemoglobin: 12.6 g/dL — ABNORMAL LOW (ref 13.0–18.0)
Lymphocytes Relative: 13 %
Lymphs Abs: 1.5 10*3/uL (ref 1.0–3.6)
MCH: 26 pg (ref 26.0–34.0)
MCHC: 33.8 g/dL (ref 32.0–36.0)
MCV: 76.9 fL — ABNORMAL LOW (ref 80.0–100.0)
MONO ABS: 0.9 10*3/uL (ref 0.2–1.0)
Monocytes Relative: 8 %
Neutro Abs: 8.5 10*3/uL — ABNORMAL HIGH (ref 1.4–6.5)
Neutrophils Relative %: 74 %
PLATELETS: 191 10*3/uL (ref 150–440)
RBC: 4.85 MIL/uL (ref 4.40–5.90)
RDW: 13.3 % (ref 11.5–14.5)
WBC: 11.3 10*3/uL — AB (ref 3.8–10.6)

## 2017-01-12 LAB — URINALYSIS, COMPLETE (UACMP) WITH MICROSCOPIC: SPECIFIC GRAVITY, URINE: 1.004 — AB (ref 1.005–1.030)

## 2017-01-12 LAB — BASIC METABOLIC PANEL
Anion gap: 9 (ref 5–15)
BUN: 20 mg/dL (ref 6–20)
CO2: 27 mmol/L (ref 22–32)
Calcium: 9.1 mg/dL (ref 8.9–10.3)
Chloride: 101 mmol/L (ref 101–111)
Creatinine, Ser: 0.73 mg/dL (ref 0.61–1.24)
GLUCOSE: 189 mg/dL — AB (ref 65–99)
Potassium: 3.9 mmol/L (ref 3.5–5.1)
SODIUM: 137 mmol/L (ref 135–145)

## 2017-01-12 LAB — PROTIME-INR
INR: 1.16
PROTHROMBIN TIME: 14.9 s (ref 11.4–15.2)

## 2017-01-12 LAB — GLUCOSE, CAPILLARY
Glucose-Capillary: 147 mg/dL — ABNORMAL HIGH (ref 65–99)
Glucose-Capillary: 195 mg/dL — ABNORMAL HIGH (ref 65–99)

## 2017-01-12 MED ORDER — LOSARTAN POTASSIUM 25 MG PO TABS: 25.0000 mg | ORAL_TABLET | Freq: Every day | ORAL | Status: DC

## 2017-01-12 MED ORDER — METFORMIN HCL 500 MG PO TABS
500.0000 mg | ORAL_TABLET | Freq: Two times a day (BID) | ORAL | Status: DC
  Administered 2017-01-12 – 2017-01-14 (×4): 500 mg via ORAL
  Filled 2017-01-12 (×4): qty 1

## 2017-01-12 MED ORDER — TAMSULOSIN HCL 0.4 MG PO CAPS
0.8000 mg | ORAL_CAPSULE | Freq: Every day | ORAL | Status: DC
  Administered 2017-01-12 – 2017-01-14 (×3): 0.8 mg via ORAL
  Filled 2017-01-12 (×3): qty 2

## 2017-01-12 MED ORDER — PIPERACILLIN-TAZOBACTAM 3.375 G IVPB 30 MIN
3.3750 g | Freq: Once | INTRAVENOUS | Status: AC
  Administered 2017-01-12: 3.375 g via INTRAVENOUS
  Filled 2017-01-12 (×2): qty 50

## 2017-01-12 MED ORDER — INSULIN ASPART 100 UNIT/ML ~~LOC~~ SOLN
0.0000 [IU] | Freq: Three times a day (TID) | SUBCUTANEOUS | Status: DC
  Administered 2017-01-12 – 2017-01-14 (×5): 1 [IU] via SUBCUTANEOUS
  Filled 2017-01-12 (×5): qty 1

## 2017-01-12 MED ORDER — CIPROFLOXACIN HCL 500 MG PO TABS: 500.0000 mg | ORAL_TABLET | Freq: Two times a day (BID) | ORAL | Status: DC

## 2017-01-12 MED ORDER — ONDANSETRON HCL 4 MG PO TABS: 4.0000 mg | ORAL_TABLET | Freq: Four times a day (QID) | ORAL | Status: DC | PRN

## 2017-01-12 MED ORDER — SENNOSIDES-DOCUSATE SODIUM 8.6-50 MG PO TABS: 1.0000 | ORAL_TABLET | Freq: Every evening | ORAL | Status: DC | PRN

## 2017-01-12 MED ORDER — LIDOCAINE HCL 2 % EX GEL
1.0000 "application " | Freq: Once | CUTANEOUS | Status: AC
  Administered 2017-01-12: 1 via URETHRAL
  Filled 2017-01-12: qty 5

## 2017-01-12 MED ORDER — SODIUM CHLORIDE 0.9 % IV BOLUS (SEPSIS)
1000.0000 mL | Freq: Once | INTRAVENOUS | Status: AC
  Administered 2017-01-12: 1000 mL via INTRAVENOUS

## 2017-01-12 MED ORDER — ACETAMINOPHEN 325 MG PO TABS
650.0000 mg | ORAL_TABLET | Freq: Four times a day (QID) | ORAL | Status: DC | PRN
  Administered 2017-01-12 – 2017-01-14 (×2): 650 mg via ORAL
  Filled 2017-01-12 (×2): qty 2

## 2017-01-12 MED ORDER — ACETAMINOPHEN 650 MG RE SUPP: 650.0000 mg | Freq: Four times a day (QID) | RECTAL | Status: DC | PRN

## 2017-01-12 MED ORDER — INSULIN ASPART 100 UNIT/ML ~~LOC~~ SOLN: 0.0000 [IU] | Freq: Every day | SUBCUTANEOUS | Status: DC

## 2017-01-12 MED ORDER — LOSARTAN POTASSIUM 25 MG PO TABS
12.5000 mg | ORAL_TABLET | Freq: Every day | ORAL | Status: DC
  Administered 2017-01-13 – 2017-01-14 (×2): 12.5 mg via ORAL
  Filled 2017-01-12 (×2): qty 1

## 2017-01-12 MED ORDER — ONDANSETRON HCL 4 MG/2ML IJ SOLN: 4.0000 mg | Freq: Four times a day (QID) | INTRAMUSCULAR | Status: DC | PRN

## 2017-01-12 MED ORDER — ATORVASTATIN CALCIUM 10 MG PO TABS
10.0000 mg | ORAL_TABLET | Freq: Every evening | ORAL | Status: DC
  Administered 2017-01-12 – 2017-01-13 (×2): 10 mg via ORAL
  Filled 2017-01-12 (×2): qty 1

## 2017-01-12 MED ORDER — SODIUM CHLORIDE 0.9 % IV SOLN
INTRAVENOUS | Status: DC
  Administered 2017-01-12 – 2017-01-13 (×4): via INTRAVENOUS

## 2017-01-12 MED ORDER — ENSURE ENLIVE PO LIQD: 237.0000 mL | Freq: Two times a day (BID) | ORAL | Status: DC

## 2017-01-12 NOTE — ED Notes (Signed)
Output 1300

## 2017-01-12 NOTE — ED Triage Notes (Signed)
Patient was seen in ER earlier tonight for c/o hematuria with blood clots in foley catheter. Patient states he has since developed clots again and has very little urine output with "feelings of fullness".

## 2017-01-12 NOTE — ED Notes (Signed)
Patient give water, peanut butter, crackers and applesauce. Awaiting meal tray.

## 2017-01-12 NOTE — ED Notes (Signed)
Pt states that he was here earlier. He has an enlarged prostate and had to get a catheter in due to urinary retention. He has had blood clots in the catheter which block his urine flow. He had it flushed on Tuesday but he has more blockage. Family at the bedside.

## 2017-01-12 NOTE — ED Notes (Signed)
Emptied pt bag. Output 1551ml

## 2017-01-12 NOTE — Progress Notes (Signed)
Subjective: Patient says he feels a little better. Still having some soreness.  Objective: Vital signs in last 24 hours: Temp:  [98 F (36.7 C)-98.6 F (37 C)] 98.6 F (37 C) (07/06 1229) Pulse Rate:  [72-98] 86 (07/06 1229) Resp:  [15-20] 15 (07/06 0808) BP: (127-158)/(61-80) 139/66 (07/06 1229) SpO2:  [97 %-100 %] 99 % (07/06 1229) Weight:  [68.8 kg (151 lb 11.2 oz)-86.2 kg (190 lb)] 68.8 kg (151 lb 11.2 oz) (07/06 0808) Weight change:  Last BM Date: 01/11/17  Intake/Output from previous day: 07/05 0701 - 07/06 0700 In: 4000  Out: -  Intake/Output this shift: Total I/O In: 530 [P.O.:480; IV Piggyback:50] Out: 7350 [Urine:6500; Other:850]  General appearance: no distress  Lab Results:  Recent Labs  01/11/17 1930 01/12/17 0523  WBC 10.4 11.3*  HGB 13.1 12.6*  HCT 39.1* 37.3*  PLT 184 191   BMET  Recent Labs  01/11/17 1930 01/12/17 0523  NA 135 137  K 3.9 3.9  CL 100* 101  CO2 25 27  GLUCOSE 178* 189*  BUN 19 20  CREATININE 0.76 0.73  CALCIUM 9.3 9.1    Studies/Results: Ct Renal Stone Study  Result Date: 01/12/2017 CLINICAL DATA:  Acute onset of hematuria.  Initial encounter. EXAM: CT ABDOMEN AND PELVIS WITHOUT CONTRAST TECHNIQUE: Multidetector CT imaging of the abdomen and pelvis was performed following the standard protocol without IV contrast. COMPARISON:  Renal ultrasound performed 03/29/2015 FINDINGS: Lower chest: Calcification is noted at the mitral valve. The visualized lung bases are clear. Hepatobiliary: The liver is unremarkable in appearance, aside from a small focus of fat at the right hepatic lobe. Stones are noted within the gallbladder. The gallbladder is contracted and otherwise unremarkable. The common bile duct remains normal in caliber. Pancreas: The pancreas is within normal limits. Spleen: The spleen is unremarkable in appearance. Adrenals/Urinary Tract: The adrenal glands are unremarkable in appearance. Nonspecific perinephric stranding is  noted bilaterally. There is no evidence of hydronephrosis. No renal or ureteral stones are identified. Stomach/Bowel: The stomach is unremarkable in appearance. The small bowel is within normal limits. The appendix is not visualized; there is no evidence for appendicitis. The colon is unremarkable in appearance. Vascular/Lymphatic: Diffuse calcification is seen along the abdominal aorta and its branches. There is ectasia of the distal abdominal aorta to 2.8 cm in AP dimension. The inferior vena cava is grossly unremarkable. No retroperitoneal lymphadenopathy is seen. No pelvic sidewall lymphadenopathy is identified. Reproductive: The bladder contains blood, with diffuse bladder wall thickening. Underlying mass cannot be excluded. A Foley catheter is noted in expected position. There is marked enlargement of the prostate, measuring 8.5 cm in transverse dimension. Other: No additional soft tissue abnormalities are seen. Musculoskeletal: No acute osseous abnormalities are identified. The visualized musculature is unremarkable in appearance. IMPRESSION: 1. Bladder contains blood, with diffuse bladder wall thickening. Underlying mass cannot be excluded. Foley catheter noted in expected position. 2. Marked enlargement of the prostate, measuring 8.5 cm in transverse dimension. Would correlate with PSA. 3. Diffuse aortic atherosclerosis. 4. Ectatic abdominal aorta at risk for aneurysm development. Recommend followup by ultrasound in 5 years. This recommendation follows ACR consensus guidelines: White Paper of the ACR Incidental Findings Committee II on Vascular Findings. J Am Coll Radiol 2013; 10:789-794. 5. Calcification at the mitral valve. 6. Cholelithiasis. Gallbladder contracted and otherwise unremarkable. Electronically Signed   By: Garald Balding M.D.   On: 01/12/2017 05:06    Medications: I have reviewed the patient's current medications.  Assessment/Plan: 1.  Gross hematuria. Secondary to trauma from changing  out Foleys. Urology recommends bladder irrigation and try to wean him off of that over the next day. He still having some periodic clots. 2. BPH. Currently on Flomax. Will likely need prostatectomy for enlarged prostate. 3. Diabetes. Continue current medications  LOS: 0 days   Baxter Hire 01/12/2017, 2:06 PM

## 2017-01-12 NOTE — H&P (Signed)
[place] [facility] [place] NAME: Lawrence Hart    MR#:  [id number]  DATE OF BIRTH:  Feb 12, 1946  DATE OF ADMISSION:  01/12/2017  PRIMARY CARE PHYSICIAN: Leonel Ramsay, MD   REQUESTING/REFERRING PHYSICIAN:   CHIEF COMPLAINT:   Chief Complaint  Patient presents with  . Urinary Retention    HISTORY OF PRESENT ILLNESS: Lawrence Hart  is a 71 y.o. male with a known history of Diabetes mellitus type 2, hypertension, prostate hypertrophy presented to the emergency room with blood in the urine. Patient had a Foley catheter put on 20th of this month for urinary retention. He visited emergency room yesterday for a maturity of his Foley catheter was irrigated and he was sent back home. He also saw urology twice in the last couple of days in the office and he had bladder irrigation and sent home. But yesterday evening around 7 PM he had more large-volume hematuria and a presented to the emergency room. Patient was started on oral ciprofloxacin antibiotic when he visited the emergency room last. No complaints of any chest pain, shortness of breath. No fever and chills. He was worked up with CT abdomen which showed inflammation of the bladder wall and diffuse enlargement of prostate.  PAST MEDICAL HISTORY:   Past Medical History:  Diagnosis Date  . Diabetes mellitus without complication ([place])   . Foley catheter in place   . Hypertension   . Prostate hypertrophy     PAST SURGICAL HISTORY: Past Surgical History:  Procedure Laterality Date  . none      SOCIAL HISTORY:  Social History  Substance Use Topics  . Smoking status: Never Smoker  . Smokeless tobacco: Never Used  . Alcohol use Yes    FAMILY HISTORY:  Family History  Problem Relation Age of Onset  . Kidney disease Mother   . Breast cancer Mother   . Heart failure Mother   . Heart disease Father   . Prostate cancer Neg Hx   . Bladder Cancer Neg Hx   . Kidney cancer Neg Hx      DRUG ALLERGIES: No Known Allergies  REVIEW OF SYSTEMS:   CONSTITUTIONAL: No fever, fatigue or weakness.  EYES: No blurred or double vision.  EARS, NOSE, AND THROAT: No tinnitus or ear pain.  RESPIRATORY: No cough, shortness of breath, wheezing or hemoptysis.  CARDIOVASCULAR: No chest pain, orthopnea, edema.  GASTROINTESTINAL: No nausea, vomiting, diarrhea or abdominal pain.  GENITOURINARY: No dysuria,  Has hematuria.  ENDOCRINE: No polyuria, nocturia,  HEMATOLOGY: No anemia, easy bruising or bleeding SKIN: No rash or lesion. MUSCULOSKELETAL: No joint pain or arthritis.   NEUROLOGIC: No tingling, numbness, weakness.  PSYCHIATRY: No anxiety or depression.   MEDICATIONS AT HOME:  Prior to Admission medications   Medication Sig Start Date End Date Taking? Authorizing Provider  aspirin EC 81 MG tablet Take 81 mg by mouth daily.    [provider]  atorvastatin (LIPITOR) 10 MG tablet Take 10 mg by mouth every evening.    [provider]  cefUROXime (CEFTIN) 500 MG tablet Take 500 mg by mouth 2 (two) times daily. for 10 days    [provider]  ciprofloxacin (CIPRO) 500 MG tablet Take 1 tablet (500 mg total) by mouth 2 (two) times daily. 01/11/17 01/21/17  Nena Polio, MD  losartan (COZAAR) 25 MG tablet Take 25 mg by mouth daily.    [provider]  metFORMIN (GLUCOPHAGE) 500 MG tablet Take  500 mg by mouth 2 (two) times daily.    [provider]  tamsulosin (FLOMAX) 0.4 MG CAPS capsule Take 2 capsules (0.8 mg total) by mouth daily. 01/08/17   Bjorn Loser, MD      PHYSICAL EXAMINATION:   VITAL SIGNS: Blood pressure 131/72, pulse 87, temperature 98.5 F (36.9 C), temperature source Oral, resp. rate 18, height 6' (1.829 m), weight 86.2 kg (190 lb), SpO2 98 %.  GENERAL:  71 y.o.-year-old patient lying in the bed with no acute distress.  EYES: Pupils equal, round, reactive to light and accommodation. No scleral icterus. Extraocular  muscles intact.  HEENT: Head atraumatic, normocephalic. Oropharynx and nasopharynx clear.  NECK:  Supple, no jugular venous distention. No thyroid enlargement, no tenderness.  LUNGS: Normal breath sounds bilaterally, no wheezing, rales,rhonchi or crepitation. No use of accessory muscles of respiration.  CARDIOVASCULAR: S1, S2 normal. No murmurs, rubs, or gallops.  ABDOMEN: Soft, nontender, nondistended. Bowel sounds present. No organomegaly or mass.  GU : has foley catheter EXTREMITIES: No pedal edema, cyanosis, or clubbing.  NEUROLOGIC: Cranial nerves II through XII are intact. Muscle strength 5/5 in all extremities. Sensation intact. Gait not checked.  PSYCHIATRIC: The patient is alert and oriented x 3.  SKIN: No obvious rash, lesion, or ulcer.   LABORATORY PANEL:   CBC  Recent Labs Lab 01/11/17 1930 01/12/17 0523  WBC 10.4 11.3*  HGB 13.1 12.6*  HCT 39.1* 37.3*  PLT 184 191  MCV 77.8* 76.9*  MCH 26.1 26.0  MCHC 33.5 33.8  RDW 13.4 13.3  LYMPHSABS 1.2 1.5  MONOABS 0.8 0.9  EOSABS 0.3 0.4  BASOSABS 0.1 0.1   ------------------------------------------------------------------------------------------------------------------  Chemistries   Recent Labs Lab 01/11/17 1930 01/12/17 0523  NA 135 137  K 3.9 3.9  CL 100* 101  CO2 25 27  GLUCOSE 178* 189*  BUN 19 20  CREATININE 0.76 0.73  CALCIUM 9.3 9.1   ------------------------------------------------------------------------------------------------------------------ estimated creatinine clearance is 90.3 mL/min (by C-G formula based on SCr of 0.73 mg/dL). ------------------------------------------------------------------------------------------------------------------ No results for input(s): TSH, T4TOTAL, T3FREE, THYROIDAB in the last 72 hours.  Invalid input(s): FREET3   Coagulation profile  Recent Labs Lab 01/12/17 0523  INR 1.16    ------------------------------------------------------------------------------------------------------------------- No results for input(s): DDIMER in the last 72 hours. -------------------------------------------------------------------------------------------------------------------  Cardiac Enzymes No results for input(s): CKMB, TROPONINI, MYOGLOBIN in the last 168 hours.  Invalid input(s): CK ------------------------------------------------------------------------------------------------------------------ Invalid input(s): POCBNP  ---------------------------------------------------------------------------------------------------------------  Urinalysis    Component Value Date/Time   COLORURINE RED (A) 01/11/2017 1904   APPEARANCEUR CLOUDY (A) 01/11/2017 1904   LABSPEC 1.006 01/11/2017 1904   PHURINE  01/11/2017 1904    TEST NOT REPORTED DUE TO COLOR INTERFERENCE OF URINE PIGMENT   GLUCOSEU (A) 01/11/2017 1904    TEST NOT REPORTED DUE TO COLOR INTERFERENCE OF URINE PIGMENT   HGBUR (A) 01/11/2017 1904    TEST NOT REPORTED DUE TO COLOR INTERFERENCE OF URINE PIGMENT   BILIRUBINUR (A) 01/11/2017 1904    TEST NOT REPORTED DUE TO COLOR INTERFERENCE OF URINE PIGMENT   KETONESUR (A) 01/11/2017 1904    TEST NOT REPORTED DUE TO COLOR INTERFERENCE OF URINE PIGMENT   PROTEINUR (A) 01/11/2017 1904    TEST NOT REPORTED DUE TO COLOR INTERFERENCE OF URINE PIGMENT   NITRITE (A) 01/11/2017 1904    TEST NOT REPORTED DUE TO COLOR INTERFERENCE OF URINE PIGMENT   LEUKOCYTESUR (A) 01/11/2017 1904    TEST NOT REPORTED DUE TO COLOR INTERFERENCE OF URINE PIGMENT  RADIOLOGY: Ct Renal Stone Study  Result Date: 01/12/2017 CLINICAL DATA:  Acute onset of hematuria.  Initial encounter. EXAM: CT ABDOMEN AND PELVIS WITHOUT CONTRAST TECHNIQUE: Multidetector CT imaging of the abdomen and pelvis was performed following the standard protocol without IV contrast. COMPARISON:  Renal ultrasound performed  03/29/2015 FINDINGS: Lower chest: Calcification is noted at the mitral valve. The visualized lung bases are clear. Hepatobiliary: The liver is unremarkable in appearance, aside from a small focus of fat at the right hepatic lobe. Stones are noted within the gallbladder. The gallbladder is contracted and otherwise unremarkable. The common bile duct remains normal in caliber. Pancreas: The pancreas is within normal limits. Spleen: The spleen is unremarkable in appearance. Adrenals/Urinary Tract: The adrenal glands are unremarkable in appearance. Nonspecific perinephric stranding is noted bilaterally. There is no evidence of hydronephrosis. No renal or ureteral stones are identified. Stomach/Bowel: The stomach is unremarkable in appearance. The small bowel is within normal limits. The appendix is not visualized; there is no evidence for appendicitis. The colon is unremarkable in appearance. Vascular/Lymphatic: Diffuse calcification is seen along the abdominal aorta and its branches. There is ectasia of the distal abdominal aorta to 2.8 cm in AP dimension. The inferior vena cava is grossly unremarkable. No retroperitoneal lymphadenopathy is seen. No pelvic sidewall lymphadenopathy is identified. Reproductive: The bladder contains blood, with diffuse bladder wall thickening. Underlying mass cannot be excluded. A Foley catheter is noted in expected position. There is marked enlargement of the prostate, measuring 8.5 cm in transverse dimension. Other: No additional soft tissue abnormalities are seen. Musculoskeletal: No acute osseous abnormalities are identified. The visualized musculature is unremarkable in appearance. IMPRESSION: 1. Bladder contains blood, with diffuse bladder wall thickening. Underlying mass cannot be excluded. Foley catheter noted in expected position. 2. Marked enlargement of the prostate, measuring 8.5 cm in transverse dimension. Would correlate with PSA. 3. Diffuse aortic atherosclerosis. 4. Ectatic  abdominal aorta at risk for aneurysm development. Recommend followup by ultrasound in 5 years. This recommendation follows ACR consensus guidelines: White Paper of the ACR Incidental Findings Committee II on Vascular Findings. J Am Coll Radiol 2013; 10:789-794. 5. Calcification at the mitral valve. 6. Cholelithiasis. Gallbladder contracted and otherwise unremarkable. Electronically Signed   By: Garald Balding M.D.   On: 01/12/2017 05:06    EKG: No orders found for this or any previous visit.  IMPRESSION AND PLAN: 71 year old male patient with history of hypertension, diabetes mellitus with indwelling Foley catheter presented to the emergency room with hematuria. Admitting diagnosis 1. Hematuria 2. Type 2 diabetes mellitus 3. Hypertension 4. Prostate hypertrophy with urinary retention Treatment plan Admit patient to medical floor Urology consultation Continuous bladder irrigation Medical management for diabetes mellitus Hold aspirin. All the records are reviewed and case discussed with ED provider. Management plans discussed with the patient, family and they are in agreement.  CODE STATUS:FULL CODE Surrogate decision maker : wife Code Status History    This patient does not have a recorded code status. Please follow your organizational policy for patients in this situation.       TOTAL TIME TAKING CARE OF THIS PATIENT: 52 minutes.    Saundra Shelling M.D on 01/12/2017 at 6:54 AM  Between 7am to 6pm - Pager - [phone]  After 6pm go to [email or web address] - password [email or web address] [facility]  [facility]  Office  [phone]  CC: Primary care physician; Leonel Ramsay, MD

## 2017-01-12 NOTE — Consult Note (Signed)
01/12/17 11:39 AM   Lawrence Hart [birth year] [id number]  Referring provider: Dr. Saundra Shelling  CC: Gross hematuria, Clot retention, and Urinary retention  HPI: The patient is a 71 year old gentleman who was readmitted to the hospital overnight with gross hematuria and clot retention. His problem initially began as large volume urinary retention of greater than 1500 cc approximately 2 weeks ago. A Foley catheter was placed at that time and he was started on Flomax. He had no gross hematuria that time. He presented to the urology office earlier this week for trial of void which he failed. Upon replacement of his Foley catheter he has developed gross hematuria. He has had multiple trips to the emergency department and our office over the last week for manual irrigation. He again presented with clot retention twice over the last 24 hours to the emergency department. An 41 French three-way catheter was placed and he was started on continuous bladder irrigation. CT revealed a massive prostate size of a baseball as well as a small amount of clot in his urinary bladder. Bladder wall thickening was also noted. Urology was consulted for further evaluation of his gross hematuria and clot retention. Currently he has had no pain from his catheter. It is on continuous bladder irrigation. His never had gross hematuria prior to this week. He denies dysuria or suprapubic discomfort currently though he hadn't suprapubic discomfort when his catheter was obstructed. He is scheduled next week for repeat trial of void in our office.  PMH: Past Medical History:  Diagnosis Date  . Diabetes mellitus without complication ([place])   . Foley catheter in place   . Hypertension   . Prostate hypertrophy     Surgical History: Past Surgical History:  Procedure Laterality Date  . none       Allergies: No Known Allergies  Family History: Family History  Problem Relation Age of Onset  . Kidney disease Mother   .  Breast cancer Mother   . Heart failure Mother   . Heart disease Father   . Prostate cancer Neg Hx   . Bladder Cancer Neg Hx   . Kidney cancer Neg Hx     Social History:  reports that he has never smoked. He has never used smokeless tobacco. He reports that he drinks alcohol. He reports that he does not use drugs.  ROS: 12 point ROS negative except for above  Physical Exam: BP (!) 144/65 (BP Location: Left Arm)   Pulse 81   Temp 98.5 F (36.9 C) (Oral)   Resp 15   Ht 6' (1.829 m)   Wt 151 lb 11.2 oz (68.8 kg)   SpO2 100%   BMI 20.57 kg/m   Constitutional:  Alert and oriented, No acute distress. HEENT: [place] AT, moist mucus membranes.  Trachea midline, no masses. Cardiovascular: No clubbing, cyanosis, or edema. Respiratory: Normal respiratory effort, no increased work of breathing. GI: Abdomen is soft, nontender, nondistended, no abdominal masses GU: No CVA tenderness. Normal phallus. Testicles descended bilaterally. 53 French three-way catheter in place light pink on minimal CBI however I am unable to manually irrigate the catheter. Skin: No rashes, bruises or suspicious lesions. Lymph: No cervical or inguinal adenopathy. Neurologic: Grossly intact, no focal deficits, moving all 4 extremities. Psychiatric: Normal mood and affect.  Laboratory Data: Lab Results  Component Value Date   WBC 11.3 (H) 01/12/2017   HGB 12.6 (L) 01/12/2017   HCT 37.3 (L) 01/12/2017   MCV 76.9 (L) 01/12/2017   PLT  191 01/12/2017    Lab Results  Component Value Date   CREATININE 0.73 01/12/2017    No results found for: PSA  No results found for: TESTOSTERONE  No results found for: HGBA1C  Urinalysis    Component Value Date/Time   COLORURINE RED (A) 01/12/2017 0814   APPEARANCEUR CLEAR 01/12/2017 0814   LABSPEC 1.004 (L) 01/12/2017 0814   PHURINE  01/12/2017 0814    TEST NOT REPORTED DUE TO COLOR INTERFERENCE OF URINE PIGMENT   GLUCOSEU (A) 01/12/2017 0814    TEST NOT REPORTED DUE TO  COLOR INTERFERENCE OF URINE PIGMENT   HGBUR (A) 01/12/2017 0814    TEST NOT REPORTED DUE TO COLOR INTERFERENCE OF URINE PIGMENT   BILIRUBINUR (A) 01/12/2017 0814    TEST NOT REPORTED DUE TO COLOR INTERFERENCE OF URINE PIGMENT   KETONESUR (A) 01/12/2017 0814    TEST NOT REPORTED DUE TO COLOR INTERFERENCE OF URINE PIGMENT   PROTEINUR (A) 01/12/2017 0814    TEST NOT REPORTED DUE TO COLOR INTERFERENCE OF URINE PIGMENT   NITRITE (A) 01/12/2017 0814    TEST NOT REPORTED DUE TO COLOR INTERFERENCE OF URINE PIGMENT   LEUKOCYTESUR (A) 01/12/2017 0814    TEST NOT REPORTED DUE TO COLOR INTERFERENCE OF URINE PIGMENT    Pertinent Imaging: CT reviewed as above  Procedure note: 30 cc was drained from the patient's 18 French three-way catheter. He was then prepped and draped in usual sterile fashion. A 24 [place] three-way hematuria catheter was then placed with immediate return of multiple large clots in urine. 30 cc was placed in the balloon. His bladder was then manually irrigated until no further clots were able to be evacuated. He was restarted on a slow drip CBI with very light pink output.  Assessment & Plan:    1. Urinary retention 2. Gross hematuria with clot retention 3. Massive BPH At this point, the patient's gross hematuria with clot retention is likely related to trauma from Foley catheter removal and replacement over the last few days. This is not surprising given how large his prostate is on CT imaging today. At this point, I would continue his 78 French hematuria three-way catheter which I personally placed this morning. I would try to wean CBI. He can be manually irrigated as needed with 120 cc of normal saline. We'll tentatively hold CBI at 5 AM tomorrow for further assess his hematuria on rounds tomorrow. He will need to go home with this catheter. In terms of his original problem of urinary retention likely secondary to BPH, he will likely need a simple prostatectomy due to the extreme  size of his prostate. Further outpatient workup for this will however be needed once his current gross hematuria has resolved. I think she would benefit from urodynamics as an outpatient secondary to his high volume urinary retention to ensure that his bladder still has function. Office cystoscopy will also be needed to further evaluate bladder wall thickening as well as for further evaluation of his gross hematuria though I believe it to be traumatic in nature. For now urology will continue to follow the patient. Hopefully he can be weaned from the CBI and discharged home tomorrow. He has follow-up already arranged for next week in our office. Would also continue the patient's home Flomax 0.8 mg at this time.    Nickie Retort, MD  [facility] [address] [place], [place] [address] [phone]

## 2017-01-12 NOTE — ED Notes (Signed)
Spoke with Dr. Wynetta Emery. Verbal order to hang another 1L slow drip to continuous bladder irrigation.

## 2017-01-12 NOTE — ED Provider Notes (Signed)
[facility] Emergency Department Provider Note   ____________________________________________   First MD Initiated Contact with Patient 01/12/17 [phone]     (approximate)  I have reviewed the triage vital signs and the nursing notes.   HISTORY  Chief Complaint Urinary Retention    HPI Lawrence Hart is a 71 y.o. male who returns to the ED from home with a chief complaint of hematuria with blood clots clogging his Foley catheter. Patient with recent urinary retention, seen in the ED with Foley placementJune 20. Seen by urology July 2 with Foley removal, but replaced later that afternoon secondary to retention. Patient states hematuria started a day later, but was unable to see urology secondary to the July 4 holiday. He picked up a Foley irrigation kit from the urology office yesterday which he did not use because his urine began to clear. However, he was seen approximately 7 PM in the ED for gross hematuria. His Foley catheter was irrigated and he was discharged home on Cipro for empiric treatment of UTI. He returns with clogged Foley catheter, blood clots, gross hematuria, now feeling fuzzy/lightheaded with generalized malaise. Takes baby aspirin only for anticoagulation. Denies recent fever, chills, chest pain, shortness of breath, abdominal pain, nausea, vomiting. Denies recent travel or trauma. Nothing makes his symptoms better or worse.   Past Medical History:  Diagnosis Date  . Diabetes mellitus without complication ([place])   . Foley catheter in place   . Hypertension     There are no active problems to display for this patient.   History reviewed. No pertinent surgical history.  Prior to Admission medications   Medication Sig Start Date End Date Taking? Authorizing Provider  aspirin EC 81 MG tablet Take 81 mg by mouth daily.    [provider]  atorvastatin (LIPITOR) 10 MG tablet Take 10 mg by mouth every evening.    [provider]  cefUROXime (CEFTIN) 500 MG tablet Take 500 mg by mouth 2 (two) times daily. for 10 days    [provider]  ciprofloxacin (CIPRO) 500 MG tablet Take 1 tablet (500 mg total) by mouth 2 (two) times daily. 01/11/17 01/21/17  Nena Polio, MD  losartan (COZAAR) 25 MG tablet Take 25 mg by mouth daily.    [provider]  metFORMIN (GLUCOPHAGE) 500 MG tablet Take 500 mg by mouth 2 (two) times daily.    [provider]  tamsulosin (FLOMAX) 0.4 MG CAPS capsule Take 2 capsules (0.8 mg total) by mouth daily. 01/08/17   Bjorn Loser, MD    Allergies Patient has no known allergies.  Family History  Problem Relation Age of Onset  . Kidney disease Mother   . Prostate cancer Neg Hx   . Bladder Cancer Neg Hx   . Kidney cancer Neg Hx     Social History Social History  Substance Use Topics  . Smoking status: Never Smoker  . Smokeless tobacco: Never Used  . Alcohol use Yes    Review of Systems  Constitutional: No fever/chills. Eyes: No visual changes. ENT: No sore throat. Cardiovascular: Denies chest pain. Respiratory: Denies shortness of breath. Gastrointestinal: No abdominal pain.  No nausea, no vomiting.  No diarrhea.  No constipation. Genitourinary: Positive for urinary retention. Positive for gross hematuria. Positive for blood clots clogging Foley catheter. Negative for dysuria. Musculoskeletal: Negative for back pain. Skin: Negative for rash. Neurological: Negative for headaches, focal weakness or numbness.   ____________________________________________   PHYSICAL EXAM:  VITAL SIGNS:  ED Triage Vitals [01/12/17 0325]  Enc Vitals Group     BP 138/72     Pulse Rate 98     Resp 20     Temp 98.5 F (36.9 C)     Temp Source Oral     SpO2 100 %     Weight 190 lb (86.2 kg)     Height 6' (1.829 m)     Head Circumference      Peak Flow      Pain Score 0     Pain Loc      Pain Edu?      Excl. in [place]?     Constitutional: Alert and  oriented. Well appearing and in no acute distress. Eyes: Conjunctivae are normal. PERRL. EOMI. Head: Atraumatic. Nose: No congestion/rhinnorhea. Mouth/Throat: Mucous membranes are moist.  Oropharynx non-erythematous. Neck: No stridor.   Cardiovascular: Normal rate, regular rhythm. Grossly normal heart sounds.  Good peripheral circulation. Respiratory: Normal respiratory effort.  No retractions. Lungs CTAB. Gastrointestinal: Soft and nontender. No distention. No abdominal bruits. No CVA tenderness. Genitourinary: Foley catheter in place. Leg bag with gross hematuria and small blood clots. Musculoskeletal: No lower extremity tenderness nor edema.  No joint effusions. Neurologic:  Normal speech and language. No gross focal neurologic deficits are appreciated.  Skin:  Skin is warm, dry and intact. No rash noted. Psychiatric: Mood and affect are normal. Speech and behavior are normal.  ____________________________________________   LABS (all labs ordered are listed, but only abnormal results are displayed)  Labs Reviewed  CBC WITH DIFFERENTIAL/PLATELET  BASIC METABOLIC PANEL  PROTIME-INR   ____________________________________________  EKG  None ____________________________________________  RADIOLOGY  Ct Renal Stone Study  Result Date: 01/12/2017 CLINICAL DATA:  Acute onset of hematuria.  Initial encounter. EXAM: CT ABDOMEN AND PELVIS WITHOUT CONTRAST TECHNIQUE: Multidetector CT imaging of the abdomen and pelvis was performed following the standard protocol without IV contrast. COMPARISON:  Renal ultrasound performed 03/29/2015 FINDINGS: Lower chest: Calcification is noted at the mitral valve. The visualized lung bases are clear. Hepatobiliary: The liver is unremarkable in appearance, aside from a small focus of fat at the right hepatic lobe. Stones are noted within the gallbladder. The gallbladder is contracted and otherwise unremarkable. The common bile duct remains normal in caliber.  Pancreas: The pancreas is within normal limits. Spleen: The spleen is unremarkable in appearance. Adrenals/Urinary Tract: The adrenal glands are unremarkable in appearance. Nonspecific perinephric stranding is noted bilaterally. There is no evidence of hydronephrosis. No renal or ureteral stones are identified. Stomach/Bowel: The stomach is unremarkable in appearance. The small bowel is within normal limits. The appendix is not visualized; there is no evidence for appendicitis. The colon is unremarkable in appearance. Vascular/Lymphatic: Diffuse calcification is seen along the abdominal aorta and its branches. There is ectasia of the distal abdominal aorta to 2.8 cm in AP dimension. The inferior vena cava is grossly unremarkable. No retroperitoneal lymphadenopathy is seen. No pelvic sidewall lymphadenopathy is identified. Reproductive: The bladder contains blood, with diffuse bladder wall thickening. Underlying mass cannot be excluded. A Foley catheter is noted in expected position. There is marked enlargement of the prostate, measuring 8.5 cm in transverse dimension. Other: No additional soft tissue abnormalities are seen. Musculoskeletal: No acute osseous abnormalities are identified. The visualized musculature is unremarkable in appearance. IMPRESSION: 1. Bladder contains blood, with diffuse bladder wall thickening. Underlying mass cannot be excluded. Foley catheter noted in expected position. 2. Marked enlargement of the prostate, measuring 8.5 cm in transverse dimension. Would  correlate with PSA. 3. Diffuse aortic atherosclerosis. 4. Ectatic abdominal aorta at risk for aneurysm development. Recommend followup by ultrasound in 5 years. This recommendation follows ACR consensus guidelines: White Paper of the ACR Incidental Findings Committee II on Vascular Findings. J Am Coll Radiol 2013; 10:789-794. 5. Calcification at the mitral valve. 6. Cholelithiasis. Gallbladder contracted and otherwise unremarkable.  Electronically Signed   By: Garald Balding M.D.   On: 01/12/2017 05:06    ____________________________________________   PROCEDURES  Procedure(s) performed: None    Procedures  Critical Care performed: No  ____________________________________________   INITIAL IMPRESSION / ASSESSMENT AND PLAN / ED COURSE  Pertinent labs & imaging results that were available during my care of the patient were reviewed by me and considered in my medical decision making (see chart for details).  71 year old male who returns to the ED within several hours of discharge for same complaint of gross hematuria with blood clots clogging Foley catheter. Now feeling dizzy and lightheaded. Will repeat basic lab work, initiate IV fluid resuscitation, bladder scan. Will start continuous bladder irrigation and obtain CT renal colic study to get a gross picture of bladder.  Clinical Course as of Jan 12 634  Fri Jan 12, 2017  0533 Approximately 200-312ms on bladder scan.  [JS]  [phone] patient and spouse on laboratory results. Patient has dropped his hematocrit 2 points within the last 10 hours. Nursing staff performing CBI currently. Will discuss with urologist on-call.  [JS]  [phone] with Dr. MAlyson Ingles(urologist on-call) who recommends covering patient with antibiotics to cover enterococcus; recommends IV Zosyn. Will also hold aspirin. Discussed with hospitalist to evaluate patient in the emergency Department for admission.  [JS]    Clinical Course User Index [JS] SPaulette Blanch MD     ____________________________________________   FINAL CLINICAL IMPRESSION(S) / ED DIAGNOSES  Final diagnoses:  Gross hematuria  Obstruction of Foley catheter, subsequent encounter      NEW MEDICATIONS STARTED DURING THIS VISIT:  New Prescriptions   No medications on file     Note:  This document was prepared using Dragon voice recognition software and may include unintentional dictation errors.      SPaulette Blanch MD 01/12/17 [phone]

## 2017-01-12 NOTE — Progress Notes (Signed)
Initial Nutrition Assessment  DOCUMENTATION CODES:   Not applicable  INTERVENTION:   Ensure Enlive po BID, each supplement provides 350 kcal and 20 grams of protein  NUTRITION DIAGNOSIS:   Increased nutrient needs related to acute illness as evidenced by increased estimated needs from protein.  GOAL:   Patient will meet greater than or equal to 90% of their needs  MONITOR:   PO intake, Supplement acceptance, Labs, Weight trends, I & O's  REASON FOR ASSESSMENT:   Malnutrition Screening Tool    ASSESSMENT:   71 year old male patient with history of hypertension, diabetes mellitus with indwelling Foley catheter presented to the emergency room with hematuria.  Unable to see pt x 2 attempts. Pt having urinary catheter exchanged today. Pt documented eating 100% of meals today. Per chart, pt appears weight stable however, pt was weighed at 151lbs today which RD suspects is an inaccurate weight; pt weighed 189lbs in December 2017 and 193lbs on admit. RD will order Ensure to help pt meet estimated protein needs; can discontinue if pt develops elevated blood glucoses. RD will get nutrition related history and nutrition related physical exam on follow up.    Medications reviewed and include: metformin  Labs reviewed: wbc- 11.3(H) cbgs- 178, 189 x 24 hrs  Unable to complete Nutrition-Focused physical exam at this time.   Diet Order:  Diet heart healthy/carb modified Room service appropriate? Yes; Fluid consistency: Thin  Skin:  Reviewed, no issues  Last BM:  7/5  Height:   Ht Readings from Last 1 Encounters:  01/12/17 6' (1.829 m)    Weight:   Wt Readings from Last 1 Encounters:  01/12/17 151 lb 11.2 oz (68.8 kg)    Ideal Body Weight:  80.9 kg  BMI:  Body mass index is 20.57 kg/m.  Estimated Nutritional Needs:   Kcal:  2200-2500kcal/day   Protein:  86-104g/day   Fluid:  >2L/day   EDUCATION NEEDS:   No education needs identified at this time  Koleen Distance MS, RD, [place] Pager #[phone] After Hours Pager: [phone]

## 2017-01-13 DIAGNOSIS — E785 Hyperlipidemia, unspecified: Secondary | ICD-10-CM | POA: Diagnosis not present

## 2017-01-13 DIAGNOSIS — N400 Enlarged prostate without lower urinary tract symptoms: Secondary | ICD-10-CM | POA: Diagnosis not present

## 2017-01-13 DIAGNOSIS — N401 Enlarged prostate with lower urinary tract symptoms: Secondary | ICD-10-CM | POA: Diagnosis not present

## 2017-01-13 DIAGNOSIS — R319 Hematuria, unspecified: Secondary | ICD-10-CM | POA: Diagnosis not present

## 2017-01-13 DIAGNOSIS — E119 Type 2 diabetes mellitus without complications: Secondary | ICD-10-CM | POA: Diagnosis not present

## 2017-01-13 DIAGNOSIS — R338 Other retention of urine: Secondary | ICD-10-CM | POA: Diagnosis not present

## 2017-01-13 DIAGNOSIS — R310 Gross hematuria: Secondary | ICD-10-CM | POA: Diagnosis not present

## 2017-01-13 LAB — BASIC METABOLIC PANEL
Anion gap: 6 (ref 5–15)
BUN: 14 mg/dL (ref 6–20)
CHLORIDE: 105 mmol/L (ref 101–111)
CO2: 30 mmol/L (ref 22–32)
Calcium: 8.7 mg/dL — ABNORMAL LOW (ref 8.9–10.3)
Creatinine, Ser: 0.82 mg/dL (ref 0.61–1.24)
GFR calc non Af Amer: >60 mL/min (ref >60)
Glucose, Bld: 154 mg/dL — ABNORMAL HIGH (ref 65–99)
POTASSIUM: 4.3 mmol/L (ref 3.5–5.1)
SODIUM: 141 mmol/L (ref 135–145)

## 2017-01-13 LAB — URINE CULTURE: Culture: <10000 — AB

## 2017-01-13 LAB — CBC
HEMATOCRIT: 33.9 % — AB (ref 40.0–52.0)
Hemoglobin: 11.5 g/dL — ABNORMAL LOW (ref 13.0–18.0)
MCH: 26.6 pg (ref 26.0–34.0)
MCHC: 34 g/dL (ref 32.0–36.0)
MCV: 78.2 fL — AB (ref 80.0–100.0)
Platelets: 164 10*3/uL (ref 150–440)
RBC: 4.34 MIL/uL — ABNORMAL LOW (ref 4.40–5.90)
RDW: 13.4 % (ref 11.5–14.5)
WBC: 8.3 10*3/uL (ref 3.8–10.6)

## 2017-01-13 LAB — GLUCOSE, CAPILLARY
GLUCOSE-CAPILLARY: 126 mg/dL — AB (ref 65–99)
GLUCOSE-CAPILLARY: 127 mg/dL — AB (ref 65–99)
Glucose-Capillary: 144 mg/dL — ABNORMAL HIGH (ref 65–99)
Glucose-Capillary: 171 mg/dL — ABNORMAL HIGH (ref 65–99)

## 2017-01-13 NOTE — Care Management CC44 (Signed)
Condition Code 44 Documentation Completed  Patient Details  Name: Lawrence Hart MRN: [id number] Date of Birth: 1946-05-12   Condition Code 44 given:  Yes Patient signature on Condition Code 44 notice:  Yes Documentation of 2 MD's agreement:  Yes Code 44 added to claim:  Yes    Chad Donoghue A, RN 01/13/2017, 1:45 PM

## 2017-01-13 NOTE — Care Management Obs Status (Signed)
[place] NOTIFICATION   Patient Details  Name: Lawrence Hart MRN: [id number] Date of Birth: Jun 11, 1946   Medicare Observation Status Notification Given:  Yes [facility] letter)    Mardene Speak, RN 01/13/2017, 1:44 PM

## 2017-01-13 NOTE — Care Management CC44 (Signed)
Condition Code 44 Documentation Completed  Patient Details  Name: BRANTLEE PENN MRN: [id number] Date of Birth: 29-May-1946   Condition Code 44 given:  Yes Patient signature on Condition Code 44 notice:  Yes Documentation of 2 MD's agreement:  Yes Code 44 added to claim:  Yes    Ival Bible, RN 01/13/2017, 3:37 PM

## 2017-01-13 NOTE — Progress Notes (Signed)
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    The urine has cleared but the patient is very anxious about going home since the hematuria just cleared.   I am going to initiate finasteride since it can reduce the risk of prostate bleeding and continue to observe.  If the urine remains clear, he should be able to go home with the foley tomorrow.     Subjective: CC: Hematuria.  Hx:  [name] is doing well this morning.   He has a massive prostate with retention and recurrent hematuria.   He has had several ER visits and was admitted last night for hematuria with clot retention.  He has no complaints but is anxious about discharge.   The CBI was held this morning and the urine is clear but he reports that he had recurrent bleeding previously after he had been cleared and sent home.  He has a mild anemia on CBC today.  He has no associated signs or symptoms.  ROS:  Review of Systems  All other systems reviewed and are negative.   Anti-infectives: Anti-infectives    Start     Dose/Rate Route Frequency Ordered Stop   01/12/17 1000  ciprofloxacin (CIPRO) tablet 500 mg  Status:  Discontinued     500 mg Oral 2 times daily 01/12/17 0808 01/12/17 0809   01/12/17 0645  piperacillin-tazobactam (ZOSYN) IVPB 3.375 g     3.375 g 100 mL/hr over 30 Minutes Intravenous  Once 01/12/17 6063 01/12/17 0723      Current Facility-Administered Medications  Medication Dose Route Frequency Provider Last Rate Last Dose  . 0.9 %  sodium chloride infusion   Intravenous Continuous Saundra Shelling, MD 75 mL/hr at 01/12/17 1959    . acetaminophen (TYLENOL) tablet 650 mg  650 mg Oral Q6H PRN Saundra Shelling, MD   650 mg at 01/12/17 1237   Or  . acetaminophen (TYLENOL) suppository 650 mg  650 mg Rectal Q6H PRN Saundra Shelling, MD      . atorvastatin (LIPITOR) tablet 10 mg  10 mg Oral QPM Pyreddy, Reatha Harps, MD   10 mg at 01/12/17 1721  . feeding supplement (ENSURE ENLIVE) (ENSURE ENLIVE)  liquid 237 mL  237 mL Oral BID BM Harrel Lemon D, MD      . insulin aspart (novoLOG) injection 0-5 Units  0-5 Units Subcutaneous QHS Harrel Lemon D, MD      . insulin aspart (novoLOG) injection 0-9 Units  0-9 Units Subcutaneous TID WC Baxter Hire, MD   1 Units at 01/13/17 [phone]  . losartan (COZAAR) tablet 12.5 mg  12.5 mg Oral Daily Baxter Hire, MD   12.5 mg at 01/13/17 0847  . metFORMIN (GLUCOPHAGE) tablet 500 mg  500 mg Oral BID WC Pyreddy, Reatha Harps, MD   500 mg at 01/13/17 0847  . ondansetron (ZOFRAN) tablet 4 mg  4 mg Oral Q6H PRN Saundra Shelling, MD       Or  . ondansetron (ZOFRAN) injection 4 mg  4 mg Intravenous Q6H PRN Pyreddy, Reatha Harps, MD      . senna-docusate (Senokot-S) tablet 1 tablet  1 tablet Oral QHS PRN Pyreddy, Reatha Harps, MD      . tamsulosin (FLOMAX) capsule 0.8 mg  0.8 mg Oral Daily Pyreddy, Pavan, MD   0.8 mg at 01/13/17 0847     Objective: Vital signs in last 24 hours: Temp:  [98.1 F (36.7 C)-98.6 F (37 C)] 98.2 F (36.8 C) (07/07 0610) Pulse Rate:  [  68-86] 68 (07/07 0610) Resp:  [17-18] 18 (07/07 0610) BP: (127-139)/(56-66) 127/63 (07/07 0610) SpO2:  [99 %-100 %] 100 % (07/07 0610)  Intake/Output from previous day: 07/06 0701 - 07/07 0700 In: 20732 [P.O.:720; I.V.:1542; IV Piggyback:50] Out: 16384 [Urine:14800] Intake/Output this shift: Total I/O In: 480 [P.O.:480] Out: -    Physical Exam  Constitutional: He is well-developed, well-nourished, and in no distress.  Genitourinary:  Genitourinary Comments: Urine is clear.     Lab Results:   Recent Labs  01/12/17 0523 01/13/17 0424  WBC 11.3* 8.3  HGB 12.6* 11.5*  HCT 37.3* 33.9*  PLT 191 164   BMET  Recent Labs  01/12/17 0523 01/13/17 0424  NA 137 141  K 3.9 4.3  CL 101 105  CO2 27 30  GLUCOSE 189* 154*  BUN 20 14  CREATININE 0.73 0.82  CALCIUM 9.1 8.7*   PT/INR  Recent Labs  01/12/17 0523  LABPROT 14.9  INR 1.16   ABG No results for input(s): PHART, HCO3 in the last 72  hours.  Invalid input(s): PCO2, PO2  Studies/Results: Ct Renal Stone Study  Result Date: 01/12/2017 CLINICAL DATA:  Acute onset of hematuria.  Initial encounter. EXAM: CT ABDOMEN AND PELVIS WITHOUT CONTRAST TECHNIQUE: Multidetector CT imaging of the abdomen and pelvis was performed following the standard protocol without IV contrast. COMPARISON:  Renal ultrasound performed 03/29/2015 FINDINGS: Lower chest: Calcification is noted at the mitral valve. The visualized lung bases are clear. Hepatobiliary: The liver is unremarkable in appearance, aside from a small focus of fat at the right hepatic lobe. Stones are noted within the gallbladder. The gallbladder is contracted and otherwise unremarkable. The common bile duct remains normal in caliber. Pancreas: The pancreas is within normal limits. Spleen: The spleen is unremarkable in appearance. Adrenals/Urinary Tract: The adrenal glands are unremarkable in appearance. Nonspecific perinephric stranding is noted bilaterally. There is no evidence of hydronephrosis. No renal or ureteral stones are identified. Stomach/Bowel: The stomach is unremarkable in appearance. The small bowel is within normal limits. The appendix is not visualized; there is no evidence for appendicitis. The colon is unremarkable in appearance. Vascular/Lymphatic: Diffuse calcification is seen along the abdominal aorta and its branches. There is ectasia of the distal abdominal aorta to 2.8 cm in AP dimension. The inferior vena cava is grossly unremarkable. No retroperitoneal lymphadenopathy is seen. No pelvic sidewall lymphadenopathy is identified. Reproductive: The bladder contains blood, with diffuse bladder wall thickening. Underlying mass cannot be excluded. A Foley catheter is noted in expected position. There is marked enlargement of the prostate, measuring 8.5 cm in transverse dimension. Other: No additional soft tissue abnormalities are seen. Musculoskeletal: No acute osseous abnormalities  are identified. The visualized musculature is unremarkable in appearance. IMPRESSION: 1. Bladder contains blood, with diffuse bladder wall thickening. Underlying mass cannot be excluded. Foley catheter noted in expected position. 2. Marked enlargement of the prostate, measuring 8.5 cm in transverse dimension. Would correlate with PSA. 3. Diffuse aortic atherosclerosis. 4. Ectatic abdominal aorta at risk for aneurysm development. Recommend followup by ultrasound in 5 years. This recommendation follows ACR consensus guidelines: White Paper of the ACR Incidental Findings Committee II on Vascular Findings. J Am Coll Radiol 2013; 10:789-794. 5. Calcification at the mitral valve. 6. Cholelithiasis. Gallbladder contracted and otherwise unremarkable. Electronically Signed   By: Garald Balding M.D.   On: 01/12/2017 05:06   CT films reviewed.  Labs reviewed.  Culture from 7/6 is pending.          LOS:  1 day    Lawrence Hart 01/13/2017 [phone] ID: Lawrence Hart, male   DOB: October 23, 1945, 71 y.o.   MRN: [id number]

## 2017-01-13 NOTE — Care Management Obs Status (Signed)
[place] NOTIFICATION   Patient Details  Name: Lawrence Hart MRN: [id number] Date of Birth: [birth year]   Medicare Observation Status Notification Given:   [facility] letter)    Mardene Speak, RN 01/13/2017, 1:44 PM

## 2017-01-13 NOTE — Progress Notes (Signed)
Patient ID: Lawrence Hart, male   DOB: 1945-12-07, 71 y.o.   MRN: [id number]  [facility] PROGRESS NOTE  Lawrence Hart ZDG:[id number] DOB: 28-Sep-1945 DOA: 01/12/2017 PCP: Leonel Ramsay, MD  HPI/Subjective: Patient nervous about going home. Patient had numerous times in the ER with bleeding. Continuous bladder irrigation stopped this morning with urine clearing a little bit. Urology recommended watching overnight. Patient having some pain in his lower abdomen.  Objective: Vitals:   01/13/17 0610 01/13/17 1349  BP: 127/63 (!) 113/47  Pulse: 68 85  Resp: 18 18  Temp: 98.2 F (36.8 C) 98.2 F (36.8 C)    Filed Weights   01/12/17 0325 01/12/17 0808  Weight: 86.2 kg (190 lb) 68.8 kg (151 lb 11.2 oz)    ROS: Review of Systems  Constitutional: Negative for chills and fever.  Eyes: Negative for blurred vision.  Respiratory: Negative for cough and shortness of breath.   Cardiovascular: Negative for chest pain.  Gastrointestinal: Positive for abdominal pain. Negative for constipation, diarrhea, nausea and vomiting.  Genitourinary: Positive for dysuria and hematuria.  Musculoskeletal: Negative for joint pain.  Neurological: Negative for dizziness and headaches.   Exam: Physical Exam  Constitutional: He is oriented to person, place, and time.  HENT:  Nose: No mucosal edema.  Mouth/Throat: No oropharyngeal exudate or posterior oropharyngeal edema.  Eyes: Conjunctivae, EOM and lids are normal. Pupils are equal, round, and reactive to light.  Neck: No JVD present. Carotid bruit is not present. No edema present. No thyroid mass and no thyromegaly present.  Cardiovascular: S1 normal and S2 normal.  Exam reveals no gallop.   No murmur heard. Pulses:      Dorsalis pedis pulses are 2+ on the right side, and 2+ on the left side.  Respiratory: No respiratory distress. He has no wheezes. He has no rhonchi. He has no rales.  GI: Soft. Bowel sounds are normal. There is  tenderness in the suprapubic area.  Musculoskeletal:       Right shoulder: He exhibits no swelling.  Lymphadenopathy:    He has no cervical adenopathy.  Neurological: He is alert and oriented to person, place, and time. No cranial nerve deficit.  Skin: Skin is warm. No rash noted. Nails show no clubbing.  Psychiatric: He has a normal mood and affect.      Data Reviewed: Basic Metabolic Panel:  Recent Labs Lab 01/11/17 1930 01/12/17 0523 01/13/17 0424  NA 135 137 141  K 3.9 3.9 4.3  CL 100* 101 105  CO2 25 27 30   GLUCOSE 178* 189* 154*  BUN 19 20 14   CREATININE 0.76 0.73 0.82  CALCIUM 9.3 9.1 8.7*   CBC:  Recent Labs Lab 01/11/17 1930 01/12/17 0523 01/13/17 0424  WBC 10.4 11.3* 8.3  NEUTROABS 8.0* 8.5*  --   HGB 13.1 12.6* 11.5*  HCT 39.1* 37.3* 33.9*  MCV 77.8* 76.9* 78.2*  PLT 184 191 164    CBG:  Recent Labs Lab 01/12/17 1632 01/12/17 2037 01/13/17 0743 01/13/17 1139  GLUCAP 147* 195* 144* 126*    Recent Results (from the past 240 hour(s))  Urine culture     Status: Abnormal   Collection Time: 01/12/17  8:14 AM  Result Value Ref Range Status   Specimen Description URINE, RANDOM  Final   Special Requests NONE  Final   Culture (A)  Final    <10,000 COLONIES/mL INSIGNIFICANT GROWTH Performed at [facility], [address] [place], [place] [address]  Report Status 01/13/2017 FINAL  Final     Studies: Ct Renal Stone Study  Result Date: 01/12/2017 CLINICAL DATA:  Acute onset of hematuria.  Initial encounter. EXAM: CT ABDOMEN AND PELVIS WITHOUT CONTRAST TECHNIQUE: Multidetector CT imaging of the abdomen and pelvis was performed following the standard protocol without IV contrast. COMPARISON:  Renal ultrasound performed 03/29/2015 FINDINGS: Lower chest: Calcification is noted at the mitral valve. The visualized lung bases are clear. Hepatobiliary: The liver is unremarkable in appearance, aside from a small focus of fat at the right hepatic  lobe. Stones are noted within the gallbladder. The gallbladder is contracted and otherwise unremarkable. The common bile duct remains normal in caliber. Pancreas: The pancreas is within normal limits. Spleen: The spleen is unremarkable in appearance. Adrenals/Urinary Tract: The adrenal glands are unremarkable in appearance. Nonspecific perinephric stranding is noted bilaterally. There is no evidence of hydronephrosis. No renal or ureteral stones are identified. Stomach/Bowel: The stomach is unremarkable in appearance. The small bowel is within normal limits. The appendix is not visualized; there is no evidence for appendicitis. The colon is unremarkable in appearance. Vascular/Lymphatic: Diffuse calcification is seen along the abdominal aorta and its branches. There is ectasia of the distal abdominal aorta to 2.8 cm in AP dimension. The inferior vena cava is grossly unremarkable. No retroperitoneal lymphadenopathy is seen. No pelvic sidewall lymphadenopathy is identified. Reproductive: The bladder contains blood, with diffuse bladder wall thickening. Underlying mass cannot be excluded. A Foley catheter is noted in expected position. There is marked enlargement of the prostate, measuring 8.5 cm in transverse dimension. Other: No additional soft tissue abnormalities are seen. Musculoskeletal: No acute osseous abnormalities are identified. The visualized musculature is unremarkable in appearance. IMPRESSION: 1. Bladder contains blood, with diffuse bladder wall thickening. Underlying mass cannot be excluded. Foley catheter noted in expected position. 2. Marked enlargement of the prostate, measuring 8.5 cm in transverse dimension. Would correlate with PSA. 3. Diffuse aortic atherosclerosis. 4. Ectatic abdominal aorta at risk for aneurysm development. Recommend followup by ultrasound in 5 years. This recommendation follows ACR consensus guidelines: White Paper of the ACR Incidental Findings Committee II on Vascular  Findings. J Am Coll Radiol 2013; 10:789-794. 5. Calcification at the mitral valve. 6. Cholelithiasis. Gallbladder contracted and otherwise unremarkable. Electronically Signed   By: Garald Balding M.D.   On: 01/12/2017 05:06    Scheduled Meds: . atorvastatin  10 mg Oral QPM  . feeding supplement (ENSURE ENLIVE)  237 mL Oral BID BM  . insulin aspart  0-5 Units Subcutaneous QHS  . insulin aspart  0-9 Units Subcutaneous TID WC  . losartan  12.5 mg Oral Daily  . metFORMIN  500 mg Oral BID WC  . tamsulosin  0.8 mg Oral Daily   Continuous Infusions: . sodium chloride 75 mL/hr at 01/13/17 1115    Assessment/Plan:  1. Hematuria. Patient had continuous bladder irrigation overnight. Watch for further bleeding today. Appreciate urology consultation. Check hemoglobin tomorrow morning. Stop aspirin. 2. Type 2 diabetes mellitus without complication on metformin and sliding scale here in the hospital 3. BPH on Flomax and finasteride. Patient has a very enlarged prostate and likely will need a procedure as outpatient 4. Hyperlipidemia unspecified on atorvastatin  Code Status:     Code Status Orders        Start     Ordered   01/12/17 0809  Full code  Continuous     01/12/17 0808    Code Status History    Date Active Date Inactive  Code Status Order ID Comments User Context   This patient has a current code status but no historical code status.     Family Communication: Wife at the bedside Disposition Plan: Home tomorrow  Consultants:  Urology  Time spent: 28 minutes  [place], [place]

## 2017-01-14 DIAGNOSIS — R310 Gross hematuria: Secondary | ICD-10-CM | POA: Diagnosis not present

## 2017-01-14 DIAGNOSIS — R319 Hematuria, unspecified: Secondary | ICD-10-CM | POA: Diagnosis not present

## 2017-01-14 DIAGNOSIS — R338 Other retention of urine: Secondary | ICD-10-CM | POA: Diagnosis not present

## 2017-01-14 DIAGNOSIS — E119 Type 2 diabetes mellitus without complications: Secondary | ICD-10-CM | POA: Diagnosis not present

## 2017-01-14 DIAGNOSIS — N400 Enlarged prostate without lower urinary tract symptoms: Secondary | ICD-10-CM | POA: Diagnosis not present

## 2017-01-14 DIAGNOSIS — E785 Hyperlipidemia, unspecified: Secondary | ICD-10-CM | POA: Diagnosis not present

## 2017-01-14 DIAGNOSIS — N401 Enlarged prostate with lower urinary tract symptoms: Secondary | ICD-10-CM | POA: Diagnosis not present

## 2017-01-14 LAB — HEMOGLOBIN: HEMOGLOBIN: 12.2 g/dL — AB (ref 13.0–18.0)

## 2017-01-14 LAB — GLUCOSE, CAPILLARY: Glucose-Capillary: 148 mg/dL — ABNORMAL HIGH (ref 65–99)

## 2017-01-14 MED ORDER — FINASTERIDE 5 MG PO TABS: 5.0000 mg | ORAL_TABLET | Freq: Every day | ORAL | 0 refills | Status: DC

## 2017-01-14 MED ORDER — FINASTERIDE 5 MG PO TABS
5.0000 mg | ORAL_TABLET | Freq: Every day | ORAL | Status: DC
  Administered 2017-01-14: 5 mg via ORAL
  Filled 2017-01-14: qty 1

## 2017-01-14 MED ORDER — ENSURE ENLIVE PO LIQD: 237.0000 mL | Freq: Two times a day (BID) | ORAL | 0 refills | Status: DC

## 2017-01-14 NOTE — Discharge Instructions (Signed)

## 2017-01-14 NOTE — Discharge Summary (Signed)
[place] [facility] [place] NAME: Lawrence Hart    MR#:  [id number]  DATE OF BIRTH:  Jul 12, 1945  DATE OF ADMISSION:  01/12/2017 ADMITTING PHYSICIAN: Saundra Shelling, MD  DATE OF DISCHARGE: 01/14/2017 11:07 AM  PRIMARY CARE PHYSICIAN: Leonel Ramsay, MD    ADMISSION DIAGNOSIS:  Gross hematuria [R31.0] Obstruction of Foley catheter, subsequent encounter [T83.091D]  DISCHARGE DIAGNOSIS:  Active Problems:   Hematuria   HOSPITAL COURSE:   1.  Gross hematuria. The patient had a larger Foley catheter placed by urology. The patient had continuous bladder irrigation. The urine cleared. The patient will follow-up with urology as outpatient. The patient's aspirin was stopped.  Hemoglobin remained stable during the hospital course and upon discharge it was 12.2.  Nursing staff to set up with leg bag upon going home. 2. Type 2 diabetes mellitus without complication on metformin. Patient was also on sliding scale while here in the hospital 3. BPH on Flomax. Finasteride was added. Case was discussed with patient and Dr. Jeffie Pollock urology at the bedside that the patient will likely need a prostatectomy as outpatient for his severely enlarged prostate. Follow-up with urology as outpatient. 3. Hyperlipidemia unspecified on atorvastatin 4. Essential hypertension on low-dose Cozaar  DISCHARGE CONDITIONS:   Satisfactory  CONSULTS OBTAINED:  Treatment Team:  Nickie Retort, MD  DRUG ALLERGIES:  No Known Allergies  DISCHARGE MEDICATIONS:   Discharge Medication List as of 01/14/2017 10:35 AM    START taking these medications   Details  feeding supplement, ENSURE ENLIVE, (ENSURE ENLIVE) LIQD Take 237 mLs by mouth 2 (two) times daily between meals., Starting Sun 01/14/2017, Print    finasteride (PROSCAR) 5 MG tablet Take 1 tablet (5 mg total) by mouth daily., Starting Sun 01/14/2017, Print      CONTINUE these medications which have NOT CHANGED   Details   atorvastatin (LIPITOR) 10 MG tablet Take 10 mg by mouth every evening., Historical Med    losartan (COZAAR) 25 MG tablet Take 12.5 mg by mouth daily. , Historical Med    metFORMIN (GLUCOPHAGE) 500 MG tablet Take 500 mg by mouth 2 (two) times daily., Historical Med    tamsulosin (FLOMAX) 0.4 MG CAPS capsule Take 2 capsules (0.8 mg total) by mouth daily., Starting Mon 01/08/2017, Fax      STOP taking these medications     aspirin EC 81 MG tablet      ciprofloxacin (CIPRO) 500 MG tablet          DISCHARGE INSTRUCTIONS:   Follow-up with PMD as outpatient  If you experience worsening of your admission symptoms, develop shortness of breath, life threatening emergency, suicidal or homicidal thoughts you must seek medical attention immediately by calling 911 or calling your MD immediately  if symptoms less severe.  You Must read complete instructions/literature along with all the possible adverse reactions/side effects for all the Medicines you take and that have been prescribed to you. Take any new Medicines after you have completely understood and accept all the possible adverse reactions/side effects.   Please note  You were cared for by a hospitalist during your hospital stay. If you have any questions about your discharge medications or the care you received while you were in the hospital after you are discharged, you can call the unit and asked to speak with the hospitalist on call if the hospitalist that took care of you is not available. Once you are discharged, your primary care physician will handle any  further medical issues. Please note that NO REFILLS for any discharge medications will be authorized once you are discharged, as it is imperative that you return to your primary care physician (or establish a relationship with a primary care physician if you do not have one) for your aftercare needs so that they can reassess your need for medications and monitor your lab  values.    Today   CHIEF COMPLAINT:   Chief Complaint  Patient presents with  . Urinary Retention    HISTORY OF PRESENT ILLNESS:  Lawrence Hart  is a 71 y.o. male presented with hematuria.   VITAL SIGNS:  Blood pressure (!) 142/62, pulse 75, temperature 97.6 F (36.4 C), temperature source Oral, resp. rate 20, height 6' (1.829 m), weight 68.8 kg (151 lb 11.2 oz), SpO2 100 %.   PHYSICAL EXAMINATION:  GENERAL:  71 y.o.-year-old patient lying in the bed with no acute distress.  EYES: Pupils equal, round, reactive to light and accommodation. No scleral icterus. Extraocular muscles intact.  HEENT: Head atraumatic, normocephalic. Oropharynx and nasopharynx clear.  NECK:  Supple, no jugular venous distention. No thyroid enlargement, no tenderness.  LUNGS: Normal breath sounds bilaterally, no wheezing, rales,rhonchi or crepitation. No use of accessory muscles of respiration.  CARDIOVASCULAR: S1, S2 normal. No murmurs, rubs, or gallops.  ABDOMEN: Soft, non-tender, non-distended. Bowel sounds present. No organomegaly or mass.  EXTREMITIES: No pedal edema, cyanosis, or clubbing.  NEUROLOGIC: Cranial nerves II through XII are intact. Muscle strength 5/5 in all extremities. Sensation intact. Gait not checked.  PSYCHIATRIC: The patient is alert and oriented x 3.  SKIN: No obvious rash, lesion, or ulcer.   DATA REVIEW:   CBC  Recent Labs Lab 01/13/17 0424 01/14/17 0417  WBC 8.3  --   HGB 11.5* 12.2*  HCT 33.9*  --   PLT 164  --     Chemistries   Recent Labs Lab 01/13/17 0424  NA 141  K 4.3  CL 105  CO2 30  GLUCOSE 154*  BUN 14  CREATININE 0.82  CALCIUM 8.7*     Microbiology Results  Results for orders placed or performed during the hospital encounter of 01/12/17  Urine culture     Status: Abnormal   Collection Time: 01/12/17  8:14 AM  Result Value Ref Range Status   Specimen Description URINE, RANDOM  Final   Special Requests NONE  Final   Culture (A)   Final    <10,000 COLONIES/mL INSIGNIFICANT GROWTH Performed at [facility], [place] [address] [place], [place] [address]    Report Status 01/13/2017 FINAL  Final     Management plans discussed with the patient, family and they are in agreement.  Case also discussed with nursing staff and Dr. Jeffie Pollock urology.  CODE STATUS:     Code Status Orders        Start     Ordered   01/12/17 0809  Full code  Continuous     01/12/17 0808    Code Status History    Date Active Date Inactive Code Status Order ID Comments User Context   This patient has a current code status but no historical code status.      TOTAL TIME TAKING CARE OF THIS PATIENT: 32 minutes.    Loletha Grayer M.D on 01/14/2017 at 1:35 PM  Between 7am to 6pm - Pager - [phone]  After 6pm go to [email or web address] - Proofreader  [facility] Office  [phone]  CC: Primary care  physician; Leonel Ramsay, MD

## 2017-01-14 NOTE — Progress Notes (Signed)
Pt d/c home; d/c instructions reviewed w/ pt; pt understanding was verbalized; IV removed, catheter in tact, gauze dressing applied. Pt being d/c with foley, pt foley converted to leg bag, pt teaching done and understanding verbalized. All pt questions answered; pt verbalized that all pt belongings were accounted for; pt left unit via wheelchair accompanied by staff

## 2017-01-14 NOTE — Progress Notes (Signed)
Assessment and Plan: Gross hematuria from massive BPH with associated clot retention.    His urine remains clear today.   He will be sent home with the foley.   I have added finasteride.    He will need a simple prostatectomy at some point in the near future.   He has f/u on Monday at [facility] urology.   Subjective: The urine remains clear this morning but he did push through a small clot in the night.   He has no associated signs or symptoms.  Hgb is back up to 12.2. ROS:  ROS  Anti-infectives: Anti-infectives    Start     Dose/Rate Route Frequency Ordered Stop   01/12/17 1000  ciprofloxacin (CIPRO) tablet 500 mg  Status:  Discontinued     500 mg Oral 2 times daily 01/12/17 0808 01/12/17 0809   01/12/17 0645  piperacillin-tazobactam (ZOSYN) IVPB 3.375 g     3.375 g 100 mL/hr over 30 Minutes Intravenous  Once 01/12/17 8119 01/12/17 0723      Current Facility-Administered Medications  Medication Dose Route Frequency Provider Last Rate Last Dose  . 0.9 %  sodium chloride infusion   Intravenous Continuous Saundra Shelling, MD 75 mL/hr at 01/13/17 2226    . acetaminophen (TYLENOL) tablet 650 mg  650 mg Oral Q6H PRN Saundra Shelling, MD   650 mg at 01/14/17 0525   Or  . acetaminophen (TYLENOL) suppository 650 mg  650 mg Rectal Q6H PRN Saundra Shelling, MD      . atorvastatin (LIPITOR) tablet 10 mg  10 mg Oral QPM Pyreddy, Reatha Harps, MD   10 mg at 01/13/17 1725  . feeding supplement (ENSURE ENLIVE) (ENSURE ENLIVE) liquid 237 mL  237 mL Oral BID BM Baxter Hire, MD      . finasteride (PROSCAR) tablet 5 mg  5 mg Oral Daily Irine Seal, MD      . insulin aspart (novoLOG) injection 0-5 Units  0-5 Units Subcutaneous QHS Harrel Lemon D, MD      . insulin aspart (novoLOG) injection 0-9 Units  0-9 Units Subcutaneous TID WC Baxter Hire, MD   1 Units at 01/13/17 1725  . losartan (COZAAR) tablet 12.5 mg  12.5 mg Oral Daily Baxter Hire, MD   12.5 mg at 01/13/17 0847  . metFORMIN (GLUCOPHAGE)  tablet 500 mg  500 mg Oral BID WC Pyreddy, Reatha Harps, MD   500 mg at 01/13/17 1725  . ondansetron (ZOFRAN) tablet 4 mg  4 mg Oral Q6H PRN Saundra Shelling, MD       Or  . ondansetron (ZOFRAN) injection 4 mg  4 mg Intravenous Q6H PRN Pyreddy, Reatha Harps, MD      . senna-docusate (Senokot-S) tablet 1 tablet  1 tablet Oral QHS PRN Saundra Shelling, MD      . tamsulosin (FLOMAX) capsule 0.8 mg  0.8 mg Oral Daily Pyreddy, Pavan, MD   0.8 mg at 01/13/17 0847     Objective: Vital signs in last 24 hours: Temp:  [97.6 F (36.4 C)-98.5 F (36.9 C)] 97.6 F (36.4 C) (07/08 0516) Pulse Rate:  [75-85] 75 (07/08 0516) Resp:  [18-20] 20 (07/08 0516) BP: (113-142)/(47-62) 142/62 (07/08 0516) SpO2:  [98 %-100 %] 100 % (07/08 0516)  Intake/Output from previous day: 07/07 0701 - 07/08 0700 In: 2262 [P.O.:840; I.V.:1422] Out: 1478 [Urine:4575] Intake/Output this shift: Total I/O In: 480 [P.O.:480] Out: -    Physical Exam  Lab Results:   Recent Labs  01/12/17 0523 01/13/17 0424  01/14/17 0417  WBC 11.3* 8.3  --   HGB 12.6* 11.5* 12.2*  HCT 37.3* 33.9*  --   PLT 191 164  --    BMET  Recent Labs  01/12/17 0523 01/13/17 0424  NA 137 141  K 3.9 4.3  CL 101 105  CO2 27 30  GLUCOSE 189* 154*  BUN 20 14  CREATININE 0.73 0.82  CALCIUM 9.1 8.7*   PT/INR  Recent Labs  01/12/17 0523  LABPROT 14.9  INR 1.16   ABG No results for input(s): PHART, HCO3 in the last 72 hours.  Invalid input(s): PCO2, PO2  Studies/Results: No results found.        LOS: 1 day    Malka So 01/14/2017 [phone] ID: Richardson Chiquito, male   DOB: [birth year], 71 y.o.   MRN: [id number]

## 2017-01-15 ENCOUNTER — Ambulatory Visit (INDEPENDENT_AMBULATORY_CARE_PROVIDER_SITE_OTHER): Payer: PPO | Admitting: Urology

## 2017-01-15 ENCOUNTER — Encounter: Payer: Self-pay | Admitting: Urology

## 2017-01-15 VITALS — BP 151/70 | Ht 72.0 in | Wt 189.9 lb

## 2017-01-15 DIAGNOSIS — R339 Retention of urine, unspecified: Secondary | ICD-10-CM | POA: Diagnosis not present

## 2017-01-15 NOTE — Progress Notes (Signed)
Cath Change/ Replacement  Patient is present today for a catheter change due to urinary retention.  31ml of water was removed from the balloon, a 24FR council tip cath was removed with out difficulty.  Patient was cleaned and prepped in a sterile fashion with betadine and 2% lidocaine jelly was instilled into the urethra. A 20 coude foley cath was replaced into the bladder no complications were noted Urine return was noted 176ml and urine was orange in color. The balloon was filled with 11ml of sterile water. A leg bag was attached for drainage.  A night bag was also given to the patient and patient was given instruction on how to change from one bag to another. Patient was given proper instruction on catheter care.    Preformed by: Elberta Leatherwood, CMA  Follow up: Follow up with Erlene Quan to discuss surgery   I agree with above, the catheter that he had and was not irrigating well. I deflated the balloon and was able to get it to irrigate some, but not perfectly. The patient was not having any hematuria and as such we removed the catheter. We then opted to put in a 20 [place] coud tipped catheter to help table and on some of the prosthetic bleeding as well as irrigate more easily.  The patient should be scheduled to discuss HoLEP versus robotic simple prostatectomy.

## 2017-01-17 DIAGNOSIS — Z09 Encounter for follow-up examination after completed treatment for conditions other than malignant neoplasm: Secondary | ICD-10-CM | POA: Diagnosis not present

## 2017-01-17 DIAGNOSIS — K644 Residual hemorrhoidal skin tags: Secondary | ICD-10-CM | POA: Diagnosis not present

## 2017-01-18 ENCOUNTER — Ambulatory Visit: Payer: PPO | Admitting: Urology

## 2017-01-23 ENCOUNTER — Encounter: Payer: Self-pay | Admitting: Urology

## 2017-01-23 ENCOUNTER — Ambulatory Visit (INDEPENDENT_AMBULATORY_CARE_PROVIDER_SITE_OTHER): Payer: PPO | Admitting: Urology

## 2017-01-23 VITALS — BP 162/75 | HR 96 | Ht 72.0 in | Wt 184.0 lb

## 2017-01-23 DIAGNOSIS — N401 Enlarged prostate with lower urinary tract symptoms: Secondary | ICD-10-CM

## 2017-01-23 DIAGNOSIS — R338 Other retention of urine: Secondary | ICD-10-CM

## 2017-01-23 DIAGNOSIS — E785 Hyperlipidemia, unspecified: Secondary | ICD-10-CM | POA: Insufficient documentation

## 2017-01-23 DIAGNOSIS — Z860100 Personal history of colon polyps, unspecified: Secondary | ICD-10-CM | POA: Insufficient documentation

## 2017-01-23 DIAGNOSIS — R809 Proteinuria, unspecified: Secondary | ICD-10-CM

## 2017-01-23 DIAGNOSIS — R310 Gross hematuria: Secondary | ICD-10-CM | POA: Diagnosis not present

## 2017-01-23 DIAGNOSIS — R972 Elevated prostate specific antigen [PSA]: Secondary | ICD-10-CM | POA: Insufficient documentation

## 2017-01-23 DIAGNOSIS — E1129 Type 2 diabetes mellitus with other diabetic kidney complication: Secondary | ICD-10-CM | POA: Insufficient documentation

## 2017-01-23 NOTE — Progress Notes (Signed)
01/23/2017 2:12 PM   Lawrence Hart 26-Jul-1945 [id number]  Referring provider: Leonel Ramsay, MD Lawrence Hart, Lawrence Hart [id number]  Chief Complaint  Patient presents with  . Urinary Retention    discuss surgery    HPI: 71 year old male who presents today to discuss management of his massive BPH and urinary retention. These had a catheter for over a month, failed voiding trials, and had admissions for gross hematuria related to catheter manipulation.  Prior to his episode of urinary retention, he had minimal voiding complaints. He did have some frequency and urgency when first diagnosed with diabetes which resolved with treatment. He did get up once at night to void but that was non-bothersome to him. He reports that his stream was previously adequate and he felt like he emptied his bladder for the most part.  He is currently on finasteride and Flomax both started at the time of retention.  Calculated prostate volume approximately 300 cc a most recent CT scan.  He did undergo a previous negative prostate biopsy proximal to 10 years ago while living in a different state.  Most recent PSA 5.88 on 03/02/16.  Prior to this, it was high as 10. Most recent rectal exam by Dr. Nicki Reaper Hart unremarkable other than for massive enlargement.    Today, he has some mild discomfort at the catheter site. His urine is no longer bloody. He feels that his energy level is low.  PMH: Past Medical History:  Diagnosis Date  . Diabetes mellitus without complication ([place])   . Foley catheter in place   . Hypertension   . Prostate hypertrophy     Surgical History: Past Surgical History:  Procedure Laterality Date  . none      Home Medications:  Allergies as of 01/23/2017   No Known Allergies     Medication List       Accurate as of 01/23/17  2:12 PM. Always use your most recent med list.          atorvastatin 10 MG tablet Commonly known as:  LIPITOR Take 10 mg  by mouth every evening.   feeding supplement (ENSURE ENLIVE) Liqd Take 237 mLs by mouth 2 (two) times daily between meals.   finasteride 5 MG tablet Commonly known as:  PROSCAR Take 1 tablet (5 mg total) by mouth daily.   losartan 25 MG tablet Commonly known as:  COZAAR Take 12.5 mg by mouth daily.   metFORMIN 500 MG tablet Commonly known as:  GLUCOPHAGE Take 500 mg by mouth 2 (two) times daily.   tamsulosin 0.4 MG Caps capsule Commonly known as:  FLOMAX Take 2 capsules (0.8 mg total) by mouth daily.       Allergies: No Known Allergies  Family History: Family History  Problem Relation Age of Onset  . Kidney disease Mother   . Breast cancer Mother   . Heart failure Mother   . Heart disease Father   . Prostate cancer Neg Hx   . Bladder Cancer Neg Hx   . Kidney cancer Neg Hx     Social History:  reports that he has never smoked. He has never used smokeless tobacco. He reports that he drinks alcohol. He reports that he does not use drugs.  ROS: UROLOGY Frequent Urination?: No Hard to postpone urination?: No Burning/pain with urination?: No Get up at night to urinate?: No Leakage of urine?: No Urine stream starts and stops?: No Trouble starting stream?: No Do you have to strain  to urinate?: No Blood in urine?: No Urinary tract infection?: No Sexually transmitted disease?: No Injury to kidneys or bladder?: No Painful intercourse?: No Weak stream?: No Erection problems?: No Penile pain?: No  Gastrointestinal Nausea?: No Vomiting?: No Indigestion/heartburn?: No Diarrhea?: No Constipation?: No  Constitutional Fever: No Night sweats?: No Weight loss?: No Fatigue?: No  Skin Skin rash/lesions?: No Itching?: No  Eyes Blurred vision?: No Double vision?: No  Ears/Nose/Throat Sore throat?: No Sinus problems?: No  Hematologic/Lymphatic Swollen glands?: No Easy bruising?: No  Cardiovascular Leg swelling?: No Chest pain?:  No  Respiratory Cough?: No Shortness of breath?: No  Endocrine Excessive thirst?: No  Musculoskeletal Back pain?: No Joint pain?: No  Neurological Headaches?: No Dizziness?: No  Psychologic Depression?: No Anxiety?: No  Physical Exam: BP (!) 162/75   Pulse 96   Ht 6' (1.829 m)   Wt 184 lb (83.5 kg)   BMI 24.95 kg/m   Constitutional:  Alert and oriented, No acute distress.  Accompanied by wife today. HEENT: Lawrence Hart AT, moist mucus membranes.  Trachea midline, no masses. Cardiovascular: No clubbing, cyanosis, bilateral lower extremity edema, right greater than left. Respiratory: Normal respiratory effort, no increased work of breathing. GI: Abdomen is soft, nontender, nondistended, no abdominal masses GU: Foley catheter in place attached to right leg Skin: No rashes, bruises or suspicious lesions. Neurologic: Grossly intact, no focal deficits, moving all 4 extremities. Psychiatric: Normal mood and affect.  Laboratory Data: Lab Results  Component Value Date   WBC 8.3 01/13/2017   HGB 12.2 (L) 01/14/2017   HCT 33.9 (L) 01/13/2017   MCV 78.2 (L) 01/13/2017   PLT 164 01/13/2017    Lab Results  Component Value Date   CREATININE 0.82 01/13/2017    Urinalysis N/a  Pertinent Imaging: CLINICAL DATA:  Acute onset of hematuria.  Initial encounter.  EXAM: CT ABDOMEN AND PELVIS WITHOUT CONTRAST  TECHNIQUE: Multidetector CT imaging of the abdomen and pelvis was performed following the standard protocol without IV contrast.  COMPARISON:  Renal ultrasound performed 03/29/2015  FINDINGS: Lower chest: Calcification is noted at the mitral valve. The visualized lung bases are clear.  Hepatobiliary: The liver is unremarkable in appearance, aside from a small focus of fat at the right hepatic lobe. Stones are noted within the gallbladder. The gallbladder is contracted and otherwise unremarkable. The common bile duct remains normal in caliber.  Pancreas: The  pancreas is within normal limits.  Spleen: The spleen is unremarkable in appearance.  Adrenals/Urinary Tract: The adrenal glands are unremarkable in appearance.  Nonspecific perinephric stranding is noted bilaterally. There is no evidence of hydronephrosis. No renal or ureteral stones are identified.  Stomach/Bowel: The stomach is unremarkable in appearance. The small bowel is within normal limits. The appendix is not visualized; there is no evidence for appendicitis. The colon is unremarkable in appearance.  Vascular/Lymphatic: Diffuse calcification is seen along the abdominal aorta and its branches. There is ectasia of the distal abdominal aorta to 2.8 cm in AP dimension. The inferior vena cava is grossly unremarkable. No retroperitoneal lymphadenopathy is seen. No pelvic sidewall lymphadenopathy is identified.  Reproductive: The bladder contains blood, with diffuse bladder wall thickening. Underlying mass cannot be excluded. A Foley catheter is noted in expected position. There is marked enlargement of the prostate, measuring 8.5 cm in transverse dimension.  Other: No additional soft tissue abnormalities are seen.  Musculoskeletal: No acute osseous abnormalities are identified. The visualized musculature is unremarkable in appearance.  IMPRESSION: 1. Bladder contains blood, with diffuse bladder  wall thickening. Underlying mass cannot be excluded. Foley catheter noted in expected position. 2. Marked enlargement of the prostate, measuring 8.5 cm in transverse dimension. Would correlate with PSA. 3. Diffuse aortic atherosclerosis. 4. Ectatic abdominal aorta at risk for aneurysm development. Recommend followup by ultrasound in 5 years. This recommendation follows ACR consensus guidelines: White Paper of the ACR Incidental Findings Committee II on Vascular Findings. J Am Coll Radiol 2013; 10:789-794. 5. Calcification at the mitral valve. 6. Cholelithiasis.  Gallbladder contracted and otherwise unremarkable.   Electronically Signed   By: Garald Balding M.D.   On: 01/12/2017 05:06  CT scan personally reviewed today.  Prostate volume was calculated today from the CT scan measuring approximately 293 cc.  Assessment & Plan:    1. Benign prostatic hyperplasia with urinary retention Massive BPH, partly 300 cc prostate with urinary retention and failed voiding trials Currently on maximal medical therapy with finasteride and Flomax although finasteride is really not reaches optimal fact Since including proceeding with urodynamics versus pursuing an outlet procedure were discussed in detail. Given that he is unable to void and generate a bladder contraction, feel that interpretation of urodynamics would be less useful.  Strongly recommend an outlet procedure today. Options including open supple prostatectomy, robotic simple prostatectomy, and holmium laser enucleation of the prostate were discussed in detail. Risk and benefits of each procedure were discussed in detail, the procedure itself, and recovery time for each. All his questions were answered. Specifically, risk of bleeding, infection, damage to stranding structures, ejaculatory dysfunction, need for postoperative Foley catheter, possible drug blood transfusion, worsening of irritative voiding symptoms, stress incontinence were all discussed in detail.  He would like to consider his options and do some research. He will let us know how he like to proceed. All questions were answered.  2. Gross hematuria Clearly related to massive BPH and catheter manipulation Continue finasteride Will evaluate bladder the time of surgery --> given bleeding with each catheter exchange, will avoid office cystoscopy   Hollice Espy, MD  [facility] [address] [place], [place] [address] [phone]  I spent 40 min with this patient of which greater than 50% was  spent in counseling and coordination of care with the patient.

## 2017-01-26 ENCOUNTER — Encounter: Payer: Self-pay | Admitting: Urology

## 2017-01-26 ENCOUNTER — Other Ambulatory Visit: Payer: Self-pay | Admitting: Radiology

## 2017-01-26 DIAGNOSIS — R338 Other retention of urine: Principal | ICD-10-CM

## 2017-01-26 DIAGNOSIS — N401 Enlarged prostate with lower urinary tract symptoms: Secondary | ICD-10-CM

## 2017-01-30 ENCOUNTER — Encounter
Admission: RE | Admit: 2017-01-30 | Discharge: 2017-01-30 | Disposition: A | Discharge status: Home / Self Care | Payer: PPO | Source: Ambulatory Visit | Attending: Urology | Admitting: Urology

## 2017-01-30 DIAGNOSIS — Z01812 Encounter for preprocedural laboratory examination: Secondary | ICD-10-CM | POA: Insufficient documentation

## 2017-01-30 DIAGNOSIS — I10 Essential (primary) hypertension: Secondary | ICD-10-CM | POA: Insufficient documentation

## 2017-01-30 DIAGNOSIS — Z01810 Encounter for preprocedural cardiovascular examination: Secondary | ICD-10-CM | POA: Diagnosis not present

## 2017-01-30 LAB — URINALYSIS, ROUTINE W REFLEX MICROSCOPIC
Bilirubin Urine: NEGATIVE
Glucose, UA: >=500 mg/dL — AB
Hgb urine dipstick: NEGATIVE
Ketones, ur: NEGATIVE mg/dL
Nitrite: POSITIVE — AB
PROTEIN: NEGATIVE mg/dL
SQUAMOUS EPITHELIAL / LPF: NONE SEEN
Specific Gravity, Urine: 1.026 (ref 1.005–1.030)
pH: 5 (ref 5.0–8.0)

## 2017-01-30 LAB — BASIC METABOLIC PANEL
ANION GAP: 9 (ref 5–15)
BUN: 23 mg/dL — ABNORMAL HIGH (ref 6–20)
CO2: 28 mmol/L (ref 22–32)
Calcium: 9.2 mg/dL (ref 8.9–10.3)
Chloride: 96 mmol/L — ABNORMAL LOW (ref 101–111)
Creatinine, Ser: 0.8 mg/dL (ref 0.61–1.24)
GFR calc Af Amer: >60 mL/min (ref >60)
GFR calc non Af Amer: >60 mL/min (ref >60)
GLUCOSE: 336 mg/dL — AB (ref 65–99)
POTASSIUM: 4.6 mmol/L (ref 3.5–5.1)
Sodium: 133 mmol/L — ABNORMAL LOW (ref 135–145)

## 2017-01-30 LAB — CBC
HEMATOCRIT: 36.4 % — AB (ref 40.0–52.0)
HEMOGLOBIN: 12.1 g/dL — AB (ref 13.0–18.0)
MCH: 25.7 pg — AB (ref 26.0–34.0)
MCHC: 33.3 g/dL (ref 32.0–36.0)
MCV: 77 fL — ABNORMAL LOW (ref 80.0–100.0)
Platelets: 242 10*3/uL (ref 150–440)
RBC: 4.73 MIL/uL (ref 4.40–5.90)
RDW: 13.8 % (ref 11.5–14.5)
WBC: 7.3 10*3/uL (ref 3.8–10.6)

## 2017-01-30 NOTE — Patient Instructions (Signed)
  Your procedure is scheduled on:02/12/17 Report to Day Surgery. [facility] SECOND FLOOR To find out your arrival time please call [phone] between 1PM - 3PM on 02/09/17  Remember: Instructions that are not followed completely may result in serious medical risk, up to and including death, or upon the discretion of your surgeon and anesthesiologist your surgery may need to be rescheduled.    _X___ 1. Do not eat food or drink liquids after midnight. No gum chewing or hard candies.     __X__ 2. No Alcohol for 24 hours before or after surgery.   ____ 3. Do Not Smoke For 24 Hours Prior to Your Surgery.   ____ 4. Bring all medications with you on the day of surgery if instructed.    __X__ 5. Notify your doctor if there is any change in your medical condition     (cold, fever, infections).       Do not wear jewelry, make-up, hairpins, clips or nail polish.  Do not wear lotions, powders, or perfumes. You may wear deodorant.  Do not shave 48 hours prior to surgery. Men may shave face and neck.  Do not bring valuables to the hospital.    [facility] is not responsible for any belongings or valuables.               Contacts, dentures or bridgework may not be worn into surgery.  Leave your suitcase in the car. After surgery it may be brought to your room.  For patients admitted to the hospital, discharge time is determined by your                treatment team.   Patients discharged the day of surgery will not be allowed to drive home.      __X__ Take these medicines the morning of surgery with A SIP OF WATER:    1.FINASTERIDE  2.TAMSULOSIN   3. LOSARTAN  4.  5.  6.  ____ Fleet Enema (as directed)   ____ Use CHG Soap as directed  ____ Use inhalers on the day of surgery  __X__ Stop metformin 2 days prior to surgery    ____ Take 1/2 of usual insulin dose the night before surgery and none on the morning of surgery.   ____ Stop Coumadin/Plavix/aspirin on   ____ Stop  Anti-inflammatories on    ____ Stop supplements until after surgery.    ____ Bring C-Pap to the hospital.

## 2017-01-31 NOTE — Pre-Procedure Instructions (Signed)
LABS CALLED AND FAXED TO [facility] AT DR [facility] AND AM[facility] AT DR Cherrie Gauze. DR Erlene Quan WILL ADDRESS UTI  AND PCP ELEVATED GLUCOSE. AS DIRECTED BY DR Amie Critchley

## 2017-02-01 ENCOUNTER — Encounter: Payer: Self-pay | Admitting: Urology

## 2017-02-01 ENCOUNTER — Ambulatory Visit (INDEPENDENT_AMBULATORY_CARE_PROVIDER_SITE_OTHER): Payer: PPO | Admitting: Urology

## 2017-02-01 VITALS — BP 147/76 | HR 88 | Ht 72.0 in | Wt 185.0 lb

## 2017-02-01 DIAGNOSIS — N390 Urinary tract infection, site not specified: Secondary | ICD-10-CM

## 2017-02-01 DIAGNOSIS — R338 Other retention of urine: Secondary | ICD-10-CM | POA: Diagnosis not present

## 2017-02-01 DIAGNOSIS — N401 Enlarged prostate with lower urinary tract symptoms: Secondary | ICD-10-CM | POA: Diagnosis not present

## 2017-02-01 LAB — URINE CULTURE

## 2017-02-01 MED ORDER — NITROFURANTOIN MONOHYD MACRO 100 MG PO CAPS: 100.0000 mg | ORAL_CAPSULE | Freq: Two times a day (BID) | ORAL | 0 refills | Status: DC

## 2017-02-01 MED ORDER — SULFAMETHOXAZOLE-TRIMETHOPRIM 800-160 MG PO TABS: 1.0000 | ORAL_TABLET | Freq: Two times a day (BID) | ORAL | 0 refills | Status: DC

## 2017-02-01 NOTE — Progress Notes (Signed)
02/01/2017 8:21 PM   Lawrence Hart 11/18/1945 [id number]  Referring provider: Leonel Ramsay, MD [place],  [address]  Chief Complaint  Patient presents with  . Benign Prostatic Hypertrophy    HPI: 71 year old male With massive BPH whose elected to undergo the procedure. He returns to the office today to discuss holmium laser enucleation of the prostate which is mostly interested in after previous discussion.  He is currently in urinary retention after several failed voiding trials. He said issues with gross hematuria and clots related to catheter manipulation.  Prior to his episode of urinary retention, he had minimal voiding complaints. He did have some frequency and urgency when first diagnosed with diabetes which resolved with treatment. He did get up once at night to void but that was non-bothersome to him. He reports that his stream was previously adequate and he felt like he emptied his bladder for the most part.  He is currently on finasteride and Flomax both started at the time of retention.  Calculated prostate volume approximately 300 cc a most recent CT scan.  He did undergo a previous negative prostate biopsy proximal to 10 years ago while living in a different state.  Most recent PSA 5.88 on 03/02/16.  Prior to this, it was high as 10. Most recent rectal exam by Dr. Nicki Reaper MacDiarmid unremarkable other than for massive enlargement.    PMH: Past Medical History:  Diagnosis Date  . Diabetes mellitus without complication ([place])   . Foley catheter in place   . Hypertension   . Prostate hypertrophy     Surgical History: Past Surgical History:  Procedure Laterality Date  . none      Home Medications:  Allergies as of 02/01/2017      Reactions   Other    NIGHT SHADE PLANTS / INDIGESTION      Medication List       Accurate as of 02/01/17  8:21 PM. Always use your most recent med list.          acetaminophen 500 MG  tablet Commonly known as:  TYLENOL Take 1,000 mg by mouth every 8 (eight) hours as needed.   atorvastatin 10 MG tablet Commonly known as:  LIPITOR Take 10 mg by mouth every evening.   CALCIUM 600 + D PO Take 1 tablet by mouth daily with supper.   feeding supplement (GLUCERNA SHAKE) Liqd Take 237 mLs by mouth 2 (two) times daily between meals.   finasteride 5 MG tablet Commonly known as:  PROSCAR Take 1 tablet (5 mg total) by mouth daily.   hydrocortisone cream 1 % Apply 1 application topically as needed for itching.   losartan 25 MG tablet Commonly known as:  COZAAR Take 12.5 mg by mouth daily.   metFORMIN 500 MG tablet Commonly known as:  GLUCOPHAGE Take 500 mg by mouth 2 (two) times daily.   multivitamin with minerals Tabs tablet Take 1 tablet by mouth daily.   nitrofurantoin (macrocrystal-monohydrate) 100 MG capsule Commonly known as:  MACROBID Take 1 capsule (100 mg total) by mouth 2 (two) times daily.   tamsulosin 0.4 MG Caps capsule Commonly known as:  FLOMAX Take 2 capsules (0.8 mg total) by mouth daily.   vitamin C 500 MG tablet Commonly known as:  ASCORBIC ACID Take 500 mg by mouth daily with supper.       Allergies:  Allergies  Allergen Reactions  . Other     NIGHT SHADE PLANTS / INDIGESTION  Family History: Family History  Problem Relation Age of Onset  . Kidney disease Mother   . Breast cancer Mother   . Heart failure Mother   . Heart disease Father   . Prostate cancer Neg Hx   . Bladder Cancer Neg Hx   . Kidney cancer Neg Hx     Social History:  reports that he has never smoked. He has never used smokeless tobacco. He reports that he drinks alcohol. He reports that he does not use drugs.  ROS: 12 point review of systems performed and is negative other than as per HPI  Physical Exam: BP (!) 147/76 (BP Location: Left Arm, Patient Position: Sitting, Cuff Size: Normal)   Pulse 88   Ht 6' (1.829 m)   Wt 185 lb (83.9 kg)   BMI 25.09  kg/m     Constitutional:  Alert and oriented, No acute distress.  Accompanied by wife today. HEENT: [place] AT, moist mucus membranes.  Trachea midline, no masses. Cardiovascular: No clubbing, cyanosis, bilateral lower extremity edema, right greater than left. Respiratory: Normal respiratory effort, no increased work of breathing. GI: Abdomen is soft, nontender, nondistended, no abdominal masses Skin: No rashes, bruises or suspicious lesions. Neurologic: Grossly intact, no focal deficits, moving all 4 extremities. Psychiatric: Normal mood and affect.  Laboratory Data: Lab Results  Component Value Date   WBC 7.3 01/30/2017   HGB 12.1 (L) 01/30/2017   HCT 36.4 (L) 01/30/2017   MCV 77.0 (L) 01/30/2017   PLT 242 01/30/2017    Lab Results  Component Value Date   CREATININE 0.80 01/30/2017    Urinalysis N/a  Pertinent Imaging: CLINICAL DATA:  Acute onset of hematuria.  Initial encounter.  EXAM: CT ABDOMEN AND PELVIS WITHOUT CONTRAST  TECHNIQUE: Multidetector CT imaging of the abdomen and pelvis was performed following the standard protocol without IV contrast.  COMPARISON:  Renal ultrasound performed 03/29/2015  FINDINGS: Lower chest: Calcification is noted at the mitral valve. The visualized lung bases are clear.  Hepatobiliary: The liver is unremarkable in appearance, aside from a small focus of fat at the right hepatic lobe. Stones are noted within the gallbladder. The gallbladder is contracted and otherwise unremarkable. The common bile duct remains normal in caliber.  Pancreas: The pancreas is within normal limits.  Spleen: The spleen is unremarkable in appearance.  Adrenals/Urinary Tract: The adrenal glands are unremarkable in appearance.  Nonspecific perinephric stranding is noted bilaterally. There is no evidence of hydronephrosis. No renal or ureteral stones are identified.  Stomach/Bowel: The stomach is unremarkable in appearance. The small bowel  is within normal limits. The appendix is not visualized; there is no evidence for appendicitis. The colon is unremarkable in appearance.  Vascular/Lymphatic: Diffuse calcification is seen along the abdominal aorta and its branches. There is ectasia of the distal abdominal aorta to 2.8 cm in AP dimension. The inferior vena cava is grossly unremarkable. No retroperitoneal lymphadenopathy is seen. No pelvic sidewall lymphadenopathy is identified.  Reproductive: The bladder contains blood, with diffuse bladder wall thickening. Underlying mass cannot be excluded. A Foley catheter is noted in expected position. There is marked enlargement of the prostate, measuring 8.5 cm in transverse dimension.  Other: No additional soft tissue abnormalities are seen.  Musculoskeletal: No acute osseous abnormalities are identified. The visualized musculature is unremarkable in appearance.  IMPRESSION: 1. Bladder contains blood, with diffuse bladder wall thickening. Underlying mass cannot be excluded. Foley catheter noted in expected position. 2. Marked enlargement of the prostate, measuring 8.5 cm in  transverse dimension. Would correlate with PSA. 3. Diffuse aortic atherosclerosis. 4. Ectatic abdominal aorta at risk for aneurysm development. Recommend followup by ultrasound in 5 years. This recommendation follows ACR consensus guidelines: White Paper of the ACR Incidental Findings Committee II on Vascular Findings. J Am Coll Radiol 2013; 10:789-794. 5. Calcification at the mitral valve. 6. Cholelithiasis. Gallbladder contracted and otherwise unremarkable.   Electronically Signed   By: Garald Balding M.D.   On: 01/12/2017 05:06  CT scan personally reviewed today.  Prostate volume was calculated today from the CT scan measuring approximately 293 cc.  Assessment & Plan:    1. Benign prostatic hyperplasia with urinary retention Massive BPH, partly 300 cc prostate with urinary retention  and failed voiding trials currently on maximal medical therapy with finasteride and Flomax   Lengthy discussion today about holmium laser enucleation of the prostate. We reviewed the procedure at length today. He understands the given the size of the gland, this procedure may or may not be successful as it is quite large. We'll plan on performing cystoscopy at the beginning of the procedure to rule out any other bladder pathology prior to proceeding with enucleation. She understands the risk of bleeding, infection, damage to surrounding structures, ejaculatory dysfunction, stress urinary incontinence, worsening of his irritative voiding symptoms, amongst others.  We'll plan on replacing his Foley catheter for 1 week postop. Surgery will be performed as an outpatient unless her significant bleeding.  2. UTI-  Positive preoperative urine culture, asymptomatic likely representing colonization Patient given a prescription for Macrobid to start 5 days prior to the procedure to reduce his bacterial load and thereby reduce the risk of urinary sepsis.   Hollice Espy, MD  [facility] [address] [place],  [address] [phone]

## 2017-02-01 NOTE — Pre-Procedure Instructions (Signed)
Amy from Dr.Brandon's office called to say she has seen pt's urine culture report and has already call in treatment, no need to fax over results or call.

## 2017-02-02 DIAGNOSIS — R809 Proteinuria, unspecified: Secondary | ICD-10-CM | POA: Diagnosis not present

## 2017-02-02 DIAGNOSIS — Z01818 Encounter for other preprocedural examination: Secondary | ICD-10-CM | POA: Diagnosis not present

## 2017-02-02 DIAGNOSIS — N401 Enlarged prostate with lower urinary tract symptoms: Secondary | ICD-10-CM | POA: Diagnosis not present

## 2017-02-02 DIAGNOSIS — E1129 Type 2 diabetes mellitus with other diabetic kidney complication: Secondary | ICD-10-CM | POA: Diagnosis not present

## 2017-02-02 DIAGNOSIS — R338 Other retention of urine: Secondary | ICD-10-CM | POA: Diagnosis not present

## 2017-02-05 NOTE — Pre-Procedure Instructions (Signed)
02/01/2017 8:21 PM   Legrand Como A Russell 03/06/1946 [id number]   2. UTI-  Positive preoperative urine culture, asymptomatic likely representing colonization Patient given a prescription for Macrobid to start 5 days prior to the procedure to reduce his bacterial load and thereby reduce the risk of urinary sepsis.   Hollice Espy, MD  [facility] [address], [place], [place] [address] [phone]

## 2017-02-05 NOTE — Pre-Procedure Instructions (Signed)
Lawrence Loma, PA - 02/02/2017 9:00 AM EDT Formatting of this note may be different from the original. Chief Complaint  Patient presents with  . Lawrence Hart  surgery scheduled with 02/12/17 Dr. Erlene Hart  . Hyperglycemia  . Foot Pain  left foot pain, lower left leg pain/cramping       Assessment and Plan 1. Pre-op evaluation 71 year old with enlarged prostate, urinary retention anticipating urologic procedure next week. Sugars elevated at preop evaluation. Multifactorial, diet, infection, less active. He has responded well to sulfonylureas past. I am optimistic this will improve his sugar and not prevent proceeding with surgical procedure. He will let us know next week how his sugar is doing and we can adjust medicine as needed..  2. Type 2 diabetes mellitus with microalbuminuria, without long-term current use of insulin ([facility]) Continue metformin 500 twice daily. - glimepiride (AMARYL) 1 MG tablet; Take 1 tablet (1 mg total) by mouth daily with breakfast. Dispense: 30 tablet; Refill: 4  3. Benign prostatic hyperplasia with urinary retention  I have personally performed this service.  [address] [place], [place]

## 2017-02-11 MED ORDER — CEFAZOLIN SODIUM-DEXTROSE 2-4 GM/100ML-% IV SOLN
2.0000 g | INTRAVENOUS | Status: AC
  Administered 2017-02-12 (×2): 2 g via INTRAVENOUS

## 2017-02-12 ENCOUNTER — Encounter: Payer: Self-pay | Admitting: *Deleted

## 2017-02-12 ENCOUNTER — Observation Stay
Admission: RE | Admit: 2017-02-12 | Discharge: 2017-02-13 | Disposition: A | Discharge status: Home / Self Care | Payer: PPO | Source: Ambulatory Visit | Attending: Urology | Admitting: Urology

## 2017-02-12 ENCOUNTER — Ambulatory Visit: Payer: PPO | Admitting: Anesthesiology

## 2017-02-12 ENCOUNTER — Encounter
Admission: RE | Disposition: A | Discharge status: Home / Self Care | Payer: Self-pay | Source: Ambulatory Visit | Attending: Urology

## 2017-02-12 DIAGNOSIS — R310 Gross hematuria: Secondary | ICD-10-CM | POA: Diagnosis not present

## 2017-02-12 DIAGNOSIS — N401 Enlarged prostate with lower urinary tract symptoms: Principal | ICD-10-CM | POA: Insufficient documentation

## 2017-02-12 DIAGNOSIS — N320 Bladder-neck obstruction: Secondary | ICD-10-CM | POA: Diagnosis not present

## 2017-02-12 DIAGNOSIS — R338 Other retention of urine: Secondary | ICD-10-CM | POA: Insufficient documentation

## 2017-02-12 DIAGNOSIS — N138 Other obstructive and reflux uropathy: Secondary | ICD-10-CM | POA: Diagnosis not present

## 2017-02-12 DIAGNOSIS — N400 Enlarged prostate without lower urinary tract symptoms: Secondary | ICD-10-CM | POA: Diagnosis not present

## 2017-02-12 DIAGNOSIS — T83091A Other mechanical complication of indwelling urethral catheter, initial encounter: Secondary | ICD-10-CM | POA: Diagnosis not present

## 2017-02-12 HISTORY — PX: HOLMIUM LASER APPLICATION: SHX5852

## 2017-02-12 HISTORY — PX: HOLEP-LASER ENUCLEATION OF THE PROSTATE WITH MORCELLATION: SHX6641

## 2017-02-12 LAB — BASIC METABOLIC PANEL
ANION GAP: 6 (ref 5–15)
BUN: 15 mg/dL (ref 6–20)
CALCIUM: 7.2 mg/dL — AB (ref 8.9–10.3)
CO2: 21 mmol/L — ABNORMAL LOW (ref 22–32)
Chloride: 114 mmol/L — ABNORMAL HIGH (ref 101–111)
Creatinine, Ser: 0.69 mg/dL (ref 0.61–1.24)
GFR calc Af Amer: >60 mL/min (ref >60)
GLUCOSE: 233 mg/dL — AB (ref 65–99)
Potassium: 3.9 mmol/L (ref 3.5–5.1)
SODIUM: 141 mmol/L (ref 135–145)

## 2017-02-12 LAB — GLUCOSE, CAPILLARY
GLUCOSE-CAPILLARY: 154 mg/dL — AB (ref 65–99)
GLUCOSE-CAPILLARY: 228 mg/dL — AB (ref 65–99)

## 2017-02-12 SURGERY — ENUCLEATION, PROSTATE, USING LASER, WITH MORCELLATION
Anesthesia: General | Site: Prostate | Wound class: Clean Contaminated

## 2017-02-12 MED ORDER — OXYCODONE HCL 5 MG PO TABS: 5.0000 mg | ORAL_TABLET | Freq: Once | ORAL | Status: DC | PRN

## 2017-02-12 MED ORDER — FENTANYL CITRATE (PF) 100 MCG/2ML IJ SOLN
INTRAMUSCULAR | Status: AC
  Administered 2017-02-12: 25 ug via INTRAVENOUS
  Filled 2017-02-12: qty 2

## 2017-02-12 MED ORDER — FENTANYL CITRATE (PF) 100 MCG/2ML IJ SOLN
INTRAMUSCULAR | Status: AC
  Filled 2017-02-12: qty 2

## 2017-02-12 MED ORDER — TAMSULOSIN HCL 0.4 MG PO CAPS
0.8000 mg | ORAL_CAPSULE | Freq: Every day | ORAL | Status: DC
  Administered 2017-02-13: 0.8 mg via ORAL
  Filled 2017-02-12: qty 2

## 2017-02-12 MED ORDER — SUGAMMADEX SODIUM 200 MG/2ML IV SOLN
INTRAVENOUS | Status: DC | PRN
  Administered 2017-02-12: 170 mg via INTRAVENOUS

## 2017-02-12 MED ORDER — ACETAMINOPHEN 10 MG/ML IV SOLN
INTRAVENOUS | Status: DC | PRN
  Administered 2017-02-12: 1000 mg via INTRAVENOUS

## 2017-02-12 MED ORDER — DIPHENHYDRAMINE HCL 12.5 MG/5ML PO ELIX
12.5000 mg | ORAL_SOLUTION | Freq: Four times a day (QID) | ORAL | Status: DC | PRN
  Filled 2017-02-12: qty 5

## 2017-02-12 MED ORDER — SEVOFLURANE IN SOLN
RESPIRATORY_TRACT | Status: AC
  Filled 2017-02-12: qty 250

## 2017-02-12 MED ORDER — ACETAMINOPHEN 325 MG PO TABS: 650.0000 mg | ORAL_TABLET | ORAL | Status: DC | PRN

## 2017-02-12 MED ORDER — DIPHENHYDRAMINE HCL 50 MG/ML IJ SOLN: 12.5000 mg | Freq: Four times a day (QID) | INTRAMUSCULAR | Status: DC | PRN

## 2017-02-12 MED ORDER — FINASTERIDE 5 MG PO TABS
5.0000 mg | ORAL_TABLET | Freq: Every day | ORAL | Status: DC
  Administered 2017-02-13: 5 mg via ORAL
  Filled 2017-02-12: qty 1

## 2017-02-12 MED ORDER — MIDAZOLAM HCL 2 MG/2ML IJ SOLN
INTRAMUSCULAR | Status: AC
  Filled 2017-02-12: qty 2

## 2017-02-12 MED ORDER — SODIUM CHLORIDE 0.9 % IV SOLN
INTRAVENOUS | Status: DC
  Administered 2017-02-12 (×4): via INTRAVENOUS

## 2017-02-12 MED ORDER — SODIUM CHLORIDE 0.9 % IV SOLN
INTRAVENOUS | Status: DC | PRN
  Administered 2017-02-12: 25 ug/min via INTRAVENOUS

## 2017-02-12 MED ORDER — PROMETHAZINE HCL 25 MG/ML IJ SOLN: 6.2500 mg | INTRAMUSCULAR | Status: DC | PRN

## 2017-02-12 MED ORDER — EPHEDRINE SULFATE 50 MG/ML IJ SOLN
INTRAMUSCULAR | Status: DC | PRN
  Administered 2017-02-12 (×2): 10 mg via INTRAVENOUS
  Administered 2017-02-12: 20 mg via INTRAVENOUS
  Administered 2017-02-12: 10 mg via INTRAVENOUS
  Administered 2017-02-12: 5 mg via INTRAVENOUS

## 2017-02-12 MED ORDER — LOSARTAN POTASSIUM 25 MG PO TABS
12.5000 mg | ORAL_TABLET | Freq: Every day | ORAL | Status: DC
  Administered 2017-02-13: 12.5 mg via ORAL
  Filled 2017-02-12: qty 1

## 2017-02-12 MED ORDER — CEFAZOLIN SODIUM 1 G IJ SOLR
INTRAMUSCULAR | Status: AC
  Filled 2017-02-12: qty 10

## 2017-02-12 MED ORDER — OXYCODONE HCL 5 MG/5ML PO SOLN: 5.0000 mg | Freq: Once | ORAL | Status: DC | PRN

## 2017-02-12 MED ORDER — FAMOTIDINE 20 MG PO TABS
20.0000 mg | ORAL_TABLET | Freq: Once | ORAL | Status: AC
  Administered 2017-02-12: 20 mg via ORAL

## 2017-02-12 MED ORDER — MIDAZOLAM HCL 2 MG/2ML IJ SOLN
INTRAMUSCULAR | Status: DC | PRN
  Administered 2017-02-12: 2 mg via INTRAVENOUS

## 2017-02-12 MED ORDER — FUROSEMIDE 10 MG/ML IJ SOLN
INTRAMUSCULAR | Status: DC | PRN
  Administered 2017-02-12: 10 mg via INTRAVENOUS

## 2017-02-12 MED ORDER — OXYBUTYNIN CHLORIDE 5 MG PO TABS
5.0000 mg | ORAL_TABLET | Freq: Three times a day (TID) | ORAL | Status: DC | PRN
  Administered 2017-02-13: 5 mg via ORAL
  Filled 2017-02-12: qty 1

## 2017-02-12 MED ORDER — OXYCODONE-ACETAMINOPHEN 5-325 MG PO TABS
1.0000 | ORAL_TABLET | ORAL | Status: DC | PRN
  Administered 2017-02-12: 2 via ORAL
  Filled 2017-02-12: qty 2

## 2017-02-12 MED ORDER — NITROFURANTOIN MONOHYD MACRO 100 MG PO CAPS
100.0000 mg | ORAL_CAPSULE | Freq: Two times a day (BID) | ORAL | Status: DC
  Administered 2017-02-12: 100 mg via ORAL
  Filled 2017-02-12 (×3): qty 1

## 2017-02-12 MED ORDER — ATORVASTATIN CALCIUM 10 MG PO TABS
10.0000 mg | ORAL_TABLET | Freq: Every evening | ORAL | Status: DC
  Administered 2017-02-12: 10 mg via ORAL
  Filled 2017-02-12: qty 1

## 2017-02-12 MED ORDER — ONDANSETRON HCL 4 MG/2ML IJ SOLN
INTRAMUSCULAR | Status: AC
Start: 2017-02-12 — End: 2017-02-12
  Filled 2017-02-12: qty 2

## 2017-02-12 MED ORDER — ONDANSETRON HCL 4 MG/2ML IJ SOLN: 4.0000 mg | INTRAMUSCULAR | Status: DC | PRN

## 2017-02-12 MED ORDER — SODIUM CHLORIDE 0.9 % IV SOLN
INTRAVENOUS | Status: DC
  Administered 2017-02-12 – 2017-02-13 (×2): via INTRAVENOUS

## 2017-02-12 MED ORDER — ONDANSETRON HCL 4 MG/2ML IJ SOLN
INTRAMUSCULAR | Status: DC | PRN
  Administered 2017-02-12: 4 mg via INTRAVENOUS

## 2017-02-12 MED ORDER — FENTANYL CITRATE (PF) 100 MCG/2ML IJ SOLN
25.0000 ug | INTRAMUSCULAR | Status: AC | PRN
  Administered 2017-02-12 (×6): 25 ug via INTRAVENOUS

## 2017-02-12 MED ORDER — MORPHINE SULFATE (PF) 2 MG/ML IV SOLN
2.0000 mg | INTRAVENOUS | Status: DC | PRN
  Administered 2017-02-12: 2 mg via INTRAVENOUS
  Filled 2017-02-12: qty 1

## 2017-02-12 MED ORDER — PHENYLEPHRINE HCL 10 MG/ML IJ SOLN
INTRAMUSCULAR | Status: DC | PRN
  Administered 2017-02-12 (×2): 100 ug via INTRAVENOUS
  Administered 2017-02-12: 200 ug via INTRAVENOUS

## 2017-02-12 MED ORDER — EPHEDRINE SULFATE 50 MG/ML IJ SOLN
INTRAMUSCULAR | Status: AC
Start: 2017-02-12 — End: 2017-02-12
  Filled 2017-02-12: qty 1

## 2017-02-12 MED ORDER — CEFAZOLIN SODIUM-DEXTROSE 2-4 GM/100ML-% IV SOLN
INTRAVENOUS | Status: AC
  Filled 2017-02-12: qty 100

## 2017-02-12 MED ORDER — FAMOTIDINE 20 MG PO TABS
ORAL_TABLET | ORAL | Status: AC
  Filled 2017-02-12: qty 1

## 2017-02-12 MED ORDER — MEPERIDINE HCL 50 MG/ML IJ SOLN: 6.2500 mg | INTRAMUSCULAR | Status: DC | PRN

## 2017-02-12 MED ORDER — ACETAMINOPHEN 10 MG/ML IV SOLN
INTRAVENOUS | Status: AC
  Filled 2017-02-12: qty 100

## 2017-02-12 MED ORDER — FENTANYL CITRATE (PF) 100 MCG/2ML IJ SOLN
INTRAMUSCULAR | Status: DC | PRN
  Administered 2017-02-12 (×3): 50 ug via INTRAVENOUS

## 2017-02-12 MED ORDER — METFORMIN HCL 500 MG PO TABS
500.0000 mg | ORAL_TABLET | Freq: Two times a day (BID) | ORAL | Status: DC
  Administered 2017-02-13: 500 mg via ORAL
  Filled 2017-02-12: qty 1

## 2017-02-12 MED ORDER — SUGAMMADEX SODIUM 200 MG/2ML IV SOLN
INTRAVENOUS | Status: AC
  Filled 2017-02-12: qty 2

## 2017-02-12 MED ORDER — SUCCINYLCHOLINE CHLORIDE 20 MG/ML IJ SOLN
INTRAMUSCULAR | Status: DC | PRN
Start: 2017-02-12 — End: 2017-02-12
  Administered 2017-02-12: 100 mg via INTRAVENOUS

## 2017-02-12 MED ORDER — PROPOFOL 10 MG/ML IV BOLUS
INTRAVENOUS | Status: DC | PRN
  Administered 2017-02-12: 60 mg via INTRAVENOUS
  Administered 2017-02-12: 140 mg via INTRAVENOUS

## 2017-02-12 MED ORDER — ROCURONIUM BROMIDE 100 MG/10ML IV SOLN
INTRAVENOUS | Status: DC | PRN
  Administered 2017-02-12 (×2): 20 mg via INTRAVENOUS
  Administered 2017-02-12: 50 mg via INTRAVENOUS
  Administered 2017-02-12: 20 mg via INTRAVENOUS
  Administered 2017-02-12: 10 mg via INTRAVENOUS
  Administered 2017-02-12: 30 mg via INTRAVENOUS

## 2017-02-12 MED ORDER — DOCUSATE SODIUM 100 MG PO CAPS
100.0000 mg | ORAL_CAPSULE | Freq: Two times a day (BID) | ORAL | Status: DC
  Administered 2017-02-12 – 2017-02-13 (×2): 100 mg via ORAL
  Filled 2017-02-12 (×2): qty 1

## 2017-02-12 MED ORDER — BELLADONNA ALKALOIDS-OPIUM 16.2-60 MG RE SUPP: 1.0000 | Freq: Four times a day (QID) | RECTAL | Status: DC | PRN

## 2017-02-12 SURGICAL SUPPLY — 32 items
ADAPTER IRRIG TUBE 2 SPIKE SOL (ADAPTER) ×6 IMPLANT
BAG URO DRAIN 2000ML W/SPOUT (MISCELLANEOUS) ×3 IMPLANT
BAG URO DRAIN 4000ML (MISCELLANEOUS) ×3 IMPLANT
BLADE SURG SZ11 CARB STEEL (BLADE) ×3 IMPLANT
CATH FOL 2WAY LX 20X30 (CATHETERS) ×3 IMPLANT
CATH FOL 2WAY LX 22X30 (CATHETERS) ×3 IMPLANT
CATH FOL LEG HOLDER (MISCELLANEOUS) IMPLANT
CATH URETL 5X70 OPEN END (CATHETERS) ×3 IMPLANT
CONTAINER COLLECT MORCELLATR (MISCELLANEOUS) ×2 IMPLANT
DRAPE SHEET LG 3/4 BI-LAMINATE (DRAPES) ×3 IMPLANT
DRAPE UTILITY 15X26 TOWEL STRL (DRAPES) ×3 IMPLANT
FIBER LASER 550 (Laser) ×6 IMPLANT
FILTER OVERFLOW MORCELLATOR (FILTER) ×2 IMPLANT
GLOVE BIO SURGEON STRL SZ 6.5 (GLOVE) ×12 IMPLANT
GOWN STRL REUS W/ TWL LRG LVL3 (GOWN DISPOSABLE) ×4 IMPLANT
GOWN STRL REUS W/TWL LRG LVL3 (GOWN DISPOSABLE) ×2
KIT RM TURNOVER CYSTO AR (KITS) ×3 IMPLANT
LASER FIBER 550M SMARTSCOPE (Laser) ×3 IMPLANT
MORCELLATOR COLLECT CONTAINER (MISCELLANEOUS) ×3
MORCELLATOR OVERFLOW FILTER (FILTER) ×3
MORCELLATOR ROTATION 4.75 335 (MISCELLANEOUS) ×3 IMPLANT
PACK CYSTO AR (MISCELLANEOUS) ×3 IMPLANT
SENSORWIRE 0.038 NOT ANGLED (WIRE)
SET CYSTO W/LG BORE CLAMP LF (SET/KITS/TRAYS/PACK) IMPLANT
SET IRRIG Y TYPE TUR BLADDER L (SET/KITS/TRAYS/PACK) ×3 IMPLANT
SLEEVE PROTECTION STRL DISP (MISCELLANEOUS) ×6 IMPLANT
SOL .9 NS 3000ML IRR  AL (IV SOLUTION) ×35
SOL .9 NS 3000ML IRR UROMATIC (IV SOLUTION) ×70 IMPLANT
SYRINGE IRR TOOMEY STRL 70CC (SYRINGE) ×3 IMPLANT
TUBE PUMP MORCELLATOR PIRANHA (TUBING) ×3 IMPLANT
WATER STERILE IRR 1000ML POUR (IV SOLUTION) ×3 IMPLANT
WIRE SENSOR 0.038 NOT ANGLED (WIRE) IMPLANT

## 2017-02-12 NOTE — Op Note (Signed)
Date of procedure: 02/12/17  Preoperative diagnosis:  1. BPH with bladder outlet obstruction 2. Urinary retention 3. Gross hematuria   Postoperative diagnosis:  1. Same as above   Procedure: 1. Holmium laser enucleation of the prostate  Surgeon: Hollice Espy, MD  Anesthesia: General  Complications: None  Intraoperative findings: Massive BPH with bilobar coaptation, no discrete median lobe, and circumferential intravesical protrusion. Trigone distorted, unable to visualize UOs. Few small bladder stones layered in the dependent bladder. Moderate trabeculation. No bladder lesions.  EBL: 250 cc  Specimens: Prostate chips  Drains: 69 French two-way Foley catheter with 30 cc balloon  Indication: Lawrence Hart is a 71 y.o. patient with massive BPH and urinary retention on maximal medical therapy unable to pass a voiding trial..  After reviewing the management options for treatment, he elected to proceed with the above surgical procedure(s). We have discussed the potential benefits and risks of the procedure, side effects of the proposed treatment, the likelihood of the patient achieving the goals of the procedure, and any potential problems that might occur during the procedure or recuperation. Informed consent has been obtained.  Description of procedure:  The patient was taken to the operating room and general anesthesia was induced.  The patient was placed in the dorsal lithotomy position, prepped and draped in the usual sterile fashion, and preoperative antibiotics were administered. A preoperative time-out was performed.   A 26 French resectoscope was advanced per urethra into the bladder using a blunt coud style obturator. Once in the bladder, the laser bridge and 30 lens was brought in. The bladder is carefully inspected and noted to be moderately trabeculated. There is significant intravesical protrusion circumferentially of the prostate with mass effect into the bladder.  Due to this, there was distortion of the trigone and it was unable to visualize the ureteral orifice ease. In addition, there are few small subcentimeter size stones layered in the deep and the bladder adjacent to the prostate. No other lesions of the bladder identifiable. This point in time, 550  laser fiber was then used using settings of approximately 1.9 J and 54 Hz to make 2 incisions extending from the bladder neck meeting in the midline just proximal to the verumontanum. These were deepened down to the level of the capsule. The median lobe was then enucleated starting from a caudal to cranial direction freeing the adenoma from the underlying capsule release towards the bladder neck. He was eventually able to be cleaved from the bladder neck by incising the mucosa. Next, a curvilinear incision was made at the left prostatic apex again separating the adenoma from the capsule. Care was taken to avoid any resection beyond the verumontanum. This incision was deepened and again the prostate adenoma was rolled from a caudal to cranial direction towards the bladder neck. Unfortunately, there was some there was some undermining of the left bladder neck.  Ultimately, the anterior commissure was cleaved and and was able to free up the entire left lateral lobe en bloc.  The same exact procedure was performed then on the right lateral lobe. This side was somewhat easier as the lobe fell into the wider open fossa making the planes were easily identifiable. There is no undermining of the side of the bladder neck. Hemostasis was fairly adequate at this point time. The prostatic fossa was widely patent. The bladder was then carefully inspected. Again, the trigone was somewhat distorted and I was unable to visualize the UOs. There was extension of mucosal disruption encroaching  towards the expected left sided hemitrigone. I spent some time directly visualizing this area without convincing seeing the UO.  On the contralateral  side, the bladder neck was minimally disrupted and I was also unable to visualize the orifice on this side as well.  Finally, using the outer sheath of the resectoscope and the nephroscope, the morcellator was brought in and the bladder was allowed to distend maximally. Extremely careful with morcellation was then performed of the lateral and median lobes. Given the amount of tissue, this was fairly time consuming.  Near the end of the procedure, visualization became more difficult. Into the bladder surface times. Ultimately, 2 larger pieces were identified, one spherical and 1 oblong. I was able to grasp each of these individually out by grabbing the specimen and removing the entire scope. Finally, there were no residual pieces remaining. Hemostasis was fairly adequate. The bladder was then drained. There is good caliber of his urinary stream. A 22 French two-way Foley catheter was then placed using a catheter guide. This advanced quite easily. The balloon was filled 30 cc of sterile water. Near the end of the case, the patient was administered 10 mg of IV Lasix. I monitored his urine output for several minutes which appeared to be appropriate. The patient was then cleaned and dried, repositioned the supine position, reversed from anesthesia, and taken to the PACU in stable condition.  Plan: patient will be admitted overnight for observations. Given it was unable to visualize the UOs, we'll repeat labs in the morning, monitor urine output overnight, and obtain a renal ultrasound in the a.m. Case was discussed with the patient's wife who understands this plan. All questions were answered.   Hollice Espy, M.D.

## 2017-02-12 NOTE — OR Nursing (Signed)
Dr. Erlene Quan removed foley catheter prior to procedure in  The operating room

## 2017-02-12 NOTE — Anesthesia Preprocedure Evaluation (Signed)
Anesthesia Evaluation  Patient identified by MRN, date of birth, ID band Patient awake    Reviewed: Allergy & Precautions, NPO status , Patient's Chart, lab work & pertinent test results  History of Anesthesia Complications Negative for: history of anesthetic complications  Airway Mallampati: II  TM Distance: >3 FB Neck ROM: Full    Dental  (+) Edentulous Upper, Edentulous Lower Titanium pegs for dental implants on top and bottom:   Pulmonary neg sleep apnea, neg COPD, former smoker,    breath sounds clear to auscultation- rhonchi (-) wheezing      Cardiovascular Exercise Tolerance: Good hypertension, Pt. on medications (-) CAD, (-) Past MI and (-) Cardiac Stents  Rhythm:Regular Rate:Normal - Systolic murmurs and - Diastolic murmurs    Neuro/Psych negative neurological ROS  negative psych ROS   GI/Hepatic negative GI ROS, Neg liver ROS,   Endo/Other  diabetes, Oral Hypoglycemic Agents  Renal/GU negative Renal ROS     Musculoskeletal negative musculoskeletal ROS (+)   Abdominal (+) - obese,   Peds  Hematology negative hematology ROS (+)   Anesthesia Other Findings Past Medical History: No date: Diabetes mellitus without complication (HCC) No date: Foley catheter in place No date: Hypertension No date: Prostate hypertrophy   Reproductive/Obstetrics                             Anesthesia Physical Anesthesia Plan  ASA: II  Anesthesia Plan: General   Post-op Pain Management:    Induction: Intravenous  PONV Risk Score and Plan: 1 and Ondansetron and Dexamethasone  Airway Management Planned: Oral ETT  Additional Equipment:   Intra-op Plan:   Post-operative Plan: Extubation in OR  Informed Consent: I have reviewed the patients History and Physical, chart, labs and discussed the procedure including the risks, benefits and alternatives for the proposed anesthesia with the patient  or authorized representative who has indicated his/her understanding and acceptance.   Dental advisory given  Plan Discussed with: CRNA and Anesthesiologist  Anesthesia Plan Comments:         Anesthesia Quick Evaluation

## 2017-02-12 NOTE — Transfer of Care (Signed)
Immediate Anesthesia Transfer of Care Note  Patient: Robb Sibal Schult  Procedure(s) Performed: Procedure(s): HOLEP-LASER ENUCLEATION OF THE PROSTATE WITH MORCELLATION (N/A) HOLMIUM LASER APPLICATION (N/A)  Patient Location: PACU  Anesthesia Type:General  Level of Consciousness: drowsy and patient cooperative  Airway & Oxygen Therapy: Patient Spontanous Breathing and Patient connected to face mask oxygen  Post-op Assessment: Report given to RN and Post -op Vital signs reviewed and stable  Post vital signs: Reviewed and stable  Last Vitals:  Vitals:   02/12/17 0835 02/12/17 1535  BP: (!) 153/81 (!) 90/53  Pulse: 79 81  Resp: 12 (!) 29  Temp: 36.5 C (!) 36.2 C    Last Pain:  Vitals:   02/12/17 0835  TempSrc: Oral         Complications: No apparent anesthesia complications

## 2017-02-12 NOTE — Anesthesia Post-op Follow-up Note (Cosign Needed)
Anesthesia QCDR form completed.        

## 2017-02-12 NOTE — H&P (View-Only) (Signed)
02/01/2017 8:21 PM   Lawrence Hart September 20, 1945 [id number]  Referring provider: Leonel Ramsay, MD [place],  [id number]  Chief Complaint  Patient presents with  . Benign Prostatic Hypertrophy    HPI: 71 year old male With massive BPH whose elected to undergo the procedure. He returns to the office today to discuss holmium laser enucleation of the prostate which is mostly interested in after previous discussion.  He is currently in urinary retention after several failed voiding trials. He said issues with gross hematuria and clots related to catheter manipulation.  Prior to his episode of urinary retention, he had minimal voiding complaints. He did have some frequency and urgency when first diagnosed with diabetes which resolved with treatment. He did get up once at night to void but that was non-bothersome to him. He reports that his stream was previously adequate and he felt like he emptied his bladder for the most part.  He is currently on finasteride and Flomax both started at the time of retention.  Calculated prostate volume approximately 300 cc a most recent CT scan.  He did undergo a previous negative prostate biopsy proximal to 10 years ago while living in a different state.  Most recent PSA 5.88 on 03/02/16.  Prior to this, it was high as 10. Most recent rectal exam by Dr. Nicki Reaper MacDiarmid unremarkable other than for massive enlargement.    PMH: Past Medical History:  Diagnosis Date  . Diabetes mellitus without complication ([place])   . Foley catheter in place   . Hypertension   . Prostate hypertrophy     Surgical History: Past Surgical History:  Procedure Laterality Date  . none      Home Medications:  Allergies as of 02/01/2017      Reactions   Other    NIGHT SHADE PLANTS / INDIGESTION      Medication List       Accurate as of 02/01/17  8:21 PM. Always use your most recent med list.          acetaminophen 500 MG  tablet Commonly known as:  TYLENOL Take 1,000 mg by mouth every 8 (eight) hours as needed.   atorvastatin 10 MG tablet Commonly known as:  LIPITOR Take 10 mg by mouth every evening.   CALCIUM 600 + D PO Take 1 tablet by mouth daily with supper.   feeding supplement (GLUCERNA SHAKE) Liqd Take 237 mLs by mouth 2 (two) times daily between meals.   finasteride 5 MG tablet Commonly known as:  PROSCAR Take 1 tablet (5 mg total) by mouth daily.   hydrocortisone cream 1 % Apply 1 application topically as needed for itching.   losartan 25 MG tablet Commonly known as:  COZAAR Take 12.5 mg by mouth daily.   metFORMIN 500 MG tablet Commonly known as:  GLUCOPHAGE Take 500 mg by mouth 2 (two) times daily.   multivitamin with minerals Tabs tablet Take 1 tablet by mouth daily.   nitrofurantoin (macrocrystal-monohydrate) 100 MG capsule Commonly known as:  MACROBID Take 1 capsule (100 mg total) by mouth 2 (two) times daily.   tamsulosin 0.4 MG Caps capsule Commonly known as:  FLOMAX Take 2 capsules (0.8 mg total) by mouth daily.   vitamin C 500 MG tablet Commonly known as:  ASCORBIC ACID Take 500 mg by mouth daily with supper.       Allergies:  Allergies  Allergen Reactions  . Other     NIGHT SHADE PLANTS / INDIGESTION  Family History: Family History  Problem Relation Age of Onset  . Kidney disease Mother   . Breast cancer Mother   . Heart failure Mother   . Heart disease Father   . Prostate cancer Neg Hx   . Bladder Cancer Neg Hx   . Kidney cancer Neg Hx     Social History:  reports that he has never smoked. He has never used smokeless tobacco. He reports that he drinks alcohol. He reports that he does not use drugs.  ROS: 12 point review of systems performed and is negative other than as per HPI  Physical Exam: BP (!) 147/76 (BP Location: Left Arm, Patient Position: Sitting, Cuff Size: Normal)   Pulse 88   Ht 6' (1.829 m)   Wt 185 lb (83.9 kg)   BMI 25.09  kg/m     Constitutional:  Alert and oriented, No acute distress.  Accompanied by wife today. HEENT: [place] AT, moist mucus membranes.  Trachea midline, no masses. Cardiovascular: No clubbing, cyanosis, bilateral lower extremity edema, right greater than left. Respiratory: Normal respiratory effort, no increased work of breathing. GI: Abdomen is soft, nontender, nondistended, no abdominal masses Skin: No rashes, bruises or suspicious lesions. Neurologic: Grossly intact, no focal deficits, moving all 4 extremities. Psychiatric: Normal mood and affect.  Laboratory Data: Lab Results  Component Value Date   WBC 7.3 01/30/2017   HGB 12.1 (L) 01/30/2017   HCT 36.4 (L) 01/30/2017   MCV 77.0 (L) 01/30/2017   PLT 242 01/30/2017    Lab Results  Component Value Date   CREATININE 0.80 01/30/2017    Urinalysis N/a  Pertinent Imaging: CLINICAL DATA:  Acute onset of hematuria.  Initial encounter.  EXAM: CT ABDOMEN AND PELVIS WITHOUT CONTRAST  TECHNIQUE: Multidetector CT imaging of the abdomen and pelvis was performed following the standard protocol without IV contrast.  COMPARISON:  Renal ultrasound performed 03/29/2015  FINDINGS: Lower chest: Calcification is noted at the mitral valve. The visualized lung bases are clear.  Hepatobiliary: The liver is unremarkable in appearance, aside from a small focus of fat at the right hepatic lobe. Stones are noted within the gallbladder. The gallbladder is contracted and otherwise unremarkable. The common bile duct remains normal in caliber.  Pancreas: The pancreas is within normal limits.  Spleen: The spleen is unremarkable in appearance.  Adrenals/Urinary Tract: The adrenal glands are unremarkable in appearance.  Nonspecific perinephric stranding is noted bilaterally. There is no evidence of hydronephrosis. No renal or ureteral stones are identified.  Stomach/Bowel: The stomach is unremarkable in appearance. The small bowel  is within normal limits. The appendix is not visualized; there is no evidence for appendicitis. The colon is unremarkable in appearance.  Vascular/Lymphatic: Diffuse calcification is seen along the abdominal aorta and its branches. There is ectasia of the distal abdominal aorta to 2.8 cm in AP dimension. The inferior vena cava is grossly unremarkable. No retroperitoneal lymphadenopathy is seen. No pelvic sidewall lymphadenopathy is identified.  Reproductive: The bladder contains blood, with diffuse bladder wall thickening. Underlying mass cannot be excluded. A Foley catheter is noted in expected position. There is marked enlargement of the prostate, measuring 8.5 cm in transverse dimension.  Other: No additional soft tissue abnormalities are seen.  Musculoskeletal: No acute osseous abnormalities are identified. The visualized musculature is unremarkable in appearance.  IMPRESSION: 1. Bladder contains blood, with diffuse bladder wall thickening. Underlying mass cannot be excluded. Foley catheter noted in expected position. 2. Marked enlargement of the prostate, measuring 8.5 cm in  transverse dimension. Would correlate with PSA. 3. Diffuse aortic atherosclerosis. 4. Ectatic abdominal aorta at risk for aneurysm development. Recommend followup by ultrasound in 5 years. This recommendation follows ACR consensus guidelines: White Paper of the ACR Incidental Findings Committee II on Vascular Findings. J Am Coll Radiol 2013; 10:789-794. 5. Calcification at the mitral valve. 6. Cholelithiasis. Gallbladder contracted and otherwise unremarkable.   Electronically Signed   By: Garald Balding M.D.   On: 01/12/2017 05:06  CT scan personally reviewed today.  Prostate volume was calculated today from the CT scan measuring approximately 293 cc.  Assessment & Plan:    1. Benign prostatic hyperplasia with urinary retention Massive BPH, partly 300 cc prostate with urinary retention  and failed voiding trials currently on maximal medical therapy with finasteride and Flomax   Lengthy discussion today about holmium laser enucleation of the prostate. We reviewed the procedure at length today. He understands the given the size of the gland, this procedure may or may not be successful as it is quite large. We'll plan on performing cystoscopy at the beginning of the procedure to rule out any other bladder pathology prior to proceeding with enucleation. She understands the risk of bleeding, infection, damage to surrounding structures, ejaculatory dysfunction, stress urinary incontinence, worsening of his irritative voiding symptoms, amongst others.  We'll plan on replacing his Foley catheter for 1 week postop. Surgery will be performed as an outpatient unless her significant bleeding.  2. UTI-  Positive preoperative urine culture, asymptomatic likely representing colonization Patient given a prescription for Macrobid to start 5 days prior to the procedure to reduce his bacterial load and thereby reduce the risk of urinary sepsis.   Hollice Espy, MD  [facility] [address] [place], [place] [address] [phone]

## 2017-02-12 NOTE — Anesthesia Procedure Notes (Signed)
Procedure Name: Intubation Date/Time: 02/12/2017 10:05 AM Performed by: [id number], Omega Slager Pre-anesthesia Checklist: Patient identified, Emergency Drugs available, Suction available, Patient being monitored and Timeout performed Patient Re-evaluated:Patient Re-evaluated prior to induction Oxygen Delivery Method: Circle system utilized Preoxygenation: Pre-oxygenation with 100% oxygen Induction Type: IV induction Laryngoscope Size: Mac and 3 Grade View: Grade II Tube type: Oral Tube size: 7.5 mm Number of attempts: 1 Airway Equipment and Method: Patient positioned with wedge pillow and Stylet Placement Confirmation: ETT inserted through vocal cords under direct vision,  positive ETCO2,  CO2 detector and breath sounds checked- equal and bilateral Secured at: 22 cm Tube secured with: Tape

## 2017-02-12 NOTE — Anesthesia Postprocedure Evaluation (Signed)
Anesthesia Post Note  Patient: Lawrence Hart  Procedure(s) Performed: Procedure(s) (LRB): HOLEP-LASER ENUCLEATION OF THE PROSTATE WITH MORCELLATION (N/A) HOLMIUM LASER APPLICATION (N/A)  Patient location during evaluation: PACU Anesthesia Type: General Level of consciousness: awake and alert Pain management: pain level controlled Vital Signs Assessment: post-procedure vital signs reviewed and stable Respiratory status: spontaneous breathing and respiratory function stable Cardiovascular status: stable Anesthetic complications: no     Last Vitals:  Vitals:   02/12/17 1600 02/12/17 1605  BP:  (!) 85/49  Pulse: 85 85  Resp: (!) 21 (!) 22  Temp:      Last Pain:  Vitals:   02/12/17 1600  TempSrc:   PainSc: 5                  KEPHART,WILLIAM K

## 2017-02-12 NOTE — Interval H&P Note (Signed)
History and Physical Interval Note:  02/12/2017 9:38 AM  Lawrence Hart  has presented today for surgery, with the diagnosis of BPH WITH BLADDER OUTLET OBSTRUCTION  The various methods of treatment have been discussed with the patient and family. After consideration of risks, benefits and other options for treatment, the patient has consented to  Procedure(s): [place] MORCELLATION (N/A) HOLMIUM LASER APPLICATION (N/A) as a surgical intervention .  The patient's history has been reviewed, patient examined, no change in status, stable for surgery.  I have reviewed the patient's chart and labs.  Questions were answered to the patient's satisfaction.    RRR CTAB  Hollice Espy

## 2017-02-13 ENCOUNTER — Emergency Department
Admission: EM | Admit: 2017-02-13 | Discharge: 2017-02-13 | Disposition: A | Discharge status: Home / Self Care | Payer: PPO | Source: Home / Self Care | Attending: Emergency Medicine | Admitting: Emergency Medicine

## 2017-02-13 ENCOUNTER — Observation Stay: Payer: PPO

## 2017-02-13 ENCOUNTER — Encounter: Payer: Self-pay | Admitting: Urology

## 2017-02-13 DIAGNOSIS — N390 Urinary tract infection, site not specified: Secondary | ICD-10-CM | POA: Diagnosis not present

## 2017-02-13 DIAGNOSIS — R310 Gross hematuria: Secondary | ICD-10-CM | POA: Diagnosis not present

## 2017-02-13 DIAGNOSIS — Z79899 Other long term (current) drug therapy: Secondary | ICD-10-CM | POA: Insufficient documentation

## 2017-02-13 DIAGNOSIS — T83091A Other mechanical complication of indwelling urethral catheter, initial encounter: Secondary | ICD-10-CM

## 2017-02-13 DIAGNOSIS — Z87891 Personal history of nicotine dependence: Secondary | ICD-10-CM

## 2017-02-13 DIAGNOSIS — E119 Type 2 diabetes mellitus without complications: Secondary | ICD-10-CM | POA: Insufficient documentation

## 2017-02-13 DIAGNOSIS — Z7984 Long term (current) use of oral hypoglycemic drugs: Secondary | ICD-10-CM

## 2017-02-13 DIAGNOSIS — T83098A Other mechanical complication of other indwelling urethral catheter, initial encounter: Secondary | ICD-10-CM

## 2017-02-13 DIAGNOSIS — I10 Essential (primary) hypertension: Secondary | ICD-10-CM | POA: Insufficient documentation

## 2017-02-13 DIAGNOSIS — Y828 Other medical devices associated with adverse incidents: Secondary | ICD-10-CM | POA: Insufficient documentation

## 2017-02-13 LAB — CBC
HCT: 23.2 % — ABNORMAL LOW (ref 40.0–52.0)
HEMOGLOBIN: 7.7 g/dL — AB (ref 13.0–18.0)
MCH: 25.8 pg — ABNORMAL LOW (ref 26.0–34.0)
MCHC: 33.2 g/dL (ref 32.0–36.0)
MCV: 77.7 fL — ABNORMAL LOW (ref 80.0–100.0)
PLATELETS: 127 10*3/uL — AB (ref 150–440)
RBC: 2.98 MIL/uL — ABNORMAL LOW (ref 4.40–5.90)
RDW: 14.8 % — ABNORMAL HIGH (ref 11.5–14.5)
WBC: 7.1 10*3/uL (ref 3.8–10.6)

## 2017-02-13 LAB — BASIC METABOLIC PANEL
Anion gap: 5 (ref 5–15)
BUN: 16 mg/dL (ref 6–20)
CALCIUM: 6.9 mg/dL — AB (ref 8.9–10.3)
CO2: 23 mmol/L (ref 22–32)
CREATININE: 0.83 mg/dL (ref 0.61–1.24)
Chloride: 113 mmol/L — ABNORMAL HIGH (ref 101–111)
Glucose, Bld: 134 mg/dL — ABNORMAL HIGH (ref 65–99)
Potassium: 3.5 mmol/L (ref 3.5–5.1)
SODIUM: 141 mmol/L (ref 135–145)

## 2017-02-13 LAB — HEMOGLOBIN AND HEMATOCRIT, BLOOD
HEMATOCRIT: 24.1 % — AB (ref 40.0–52.0)
HEMOGLOBIN: 8.1 g/dL — AB (ref 13.0–18.0)

## 2017-02-13 MED ORDER — DOCUSATE SODIUM 100 MG PO CAPS: 100.0000 mg | ORAL_CAPSULE | Freq: Two times a day (BID) | ORAL | 0 refills | Status: DC | PRN

## 2017-02-13 MED ORDER — HYDROCODONE-ACETAMINOPHEN 5-325 MG PO TABS: 1.0000 | ORAL_TABLET | Freq: Four times a day (QID) | ORAL | 0 refills | Status: DC | PRN

## 2017-02-13 MED ORDER — FINASTERIDE 5 MG PO TABS: 5.0000 mg | ORAL_TABLET | Freq: Every day | ORAL | 2 refills | Status: DC

## 2017-02-13 MED ORDER — OXYBUTYNIN CHLORIDE 5 MG PO TABS: 5.0000 mg | ORAL_TABLET | Freq: Three times a day (TID) | ORAL | 0 refills | Status: DC | PRN

## 2017-02-13 NOTE — Progress Notes (Addendum)
Pt.'s f/c noted to have several large maroon clots in tubing, catheter not draining. Urine had previously been pink-tinged throughout shift (1500cc output). Hand irrigated catheter with nearly 500 cc NS, clots cleared initially but continued to clot with minimal output. Pt tolerated irrigation without pain or discomfort. Hbg also noted to be 7.7. Dr Erlene Quan paged, awaiting callback. Will report to oncoming RN.  71 Addendum: Spoke with Dr. Erlene Quan and notified her of above details. Advised to continue hand irrigation and push po fluids, awaiting renal ultrasound. Report given to oncoming RN, Santiago Glad. Pt without obvious distress.

## 2017-02-13 NOTE — Progress Notes (Signed)
Inpatient Diabetes Program Recommendations  AACE/ADA: New Consensus Statement on Inpatient Glycemic Control (2015)  Target Ranges:  Prepandial:   less than 140 mg/dL      Peak postprandial:   less than 180 mg/dL (1-2 hours)      Critically ill patients:  140 - 180 mg/dL  Results for AVYN, COATE (MRN [id number]) as of 02/13/2017 09:56  Ref. Range 02/12/2017 14:06 02/13/2017 05:38  Glucose Latest Ref Range: 65 - 99 mg/dL 233 (H) 134 (H)   Results for ADALBERT, ALBERTO (MRN [id number]) as of 02/13/2017 09:56  Ref. Range 02/12/2017 08:50 02/12/2017 15:36  Glucose-Capillary Latest Ref Range: 65 - 99 mg/dL 154 (H) 228 (H)   Review of Glycemic Control  Diabetes history: DM2 Outpatient Diabetes medications: Metformin 500 mg BID Current orders for Inpatient glycemic control: Metformin 500 mg BID  Inpatient Diabetes Program Recommendations: Correction (SSI): While inpatient please use Glycemic Control order set to order CBGs with Novolog 0-9 units TID with meals and Novolog 0-5 units QHS. HgbA1C: Please consider ordering an A1C to evaluate glycemic control over the past 2-3 months.  Thanks, Barnie Alderman, RN, MSN, CDE Diabetes Coordinator Inpatient Diabetes Program [phone] (Team Pager from 8am to 5pm)

## 2017-02-13 NOTE — Progress Notes (Signed)
02/13/17  Subjective: Urine remained light pink overnight. This morning, he developed more clots requiring hand irrigation. No flank pain. Excellent urine output. Creatinine stable. Ultrasound today does show that his Foley is fallen into his prostatic fossa with a mass in the bladder either representing clot or tissue.  There is mild renal pelvic fullness on the left extending down to the bladder.  Catheter was deflated, advanced and [place], and reinflated without difficulty. His hand irrigated with greater than a liter fluid was very minimal clot. Remained slightly pink and draining well.  Anti-infectives: Anti-infectives    Start     Dose/Rate Route Frequency Ordered Stop   02/12/17 0852  ceFAZolin (ANCEF) 2-4 GM/100ML-% IVPB    Comments:  Josephina Shih   : cabinet override      02/12/17 1025 02/12/17 2059   02/11/17 2248  ceFAZolin (ANCEF) IVPB 2g/100 mL premix     2 g 200 mL/hr over 30 Minutes Intravenous 30 min pre-op 02/11/17 2248 02/12/17 1440      Current Facility-Administered Medications  Medication Dose Route Frequency Provider Last Rate Last Dose  . 0.9 %  sodium chloride infusion   Intravenous Continuous Hollice Espy, MD 125 mL/hr at 02/13/17 0239    . acetaminophen (TYLENOL) tablet 650 mg  650 mg Oral Q4H PRN Hollice Espy, MD      . atorvastatin (LIPITOR) tablet 10 mg  10 mg Oral QPM Hollice Espy, MD   10 mg at 02/12/17 1753  . diphenhydrAMINE (BENADRYL) injection 12.5 mg  12.5 mg Intravenous Q6H PRN Hollice Espy, MD       Or  . diphenhydrAMINE (BENADRYL) 12.5 MG/5ML elixir 12.5 mg  12.5 mg Oral Q6H PRN Hollice Espy, MD      . docusate sodium (COLACE) capsule 100 mg  100 mg Oral BID Hollice Espy, MD   100 mg at 02/13/17 0954  . finasteride (PROSCAR) tablet 5 mg  5 mg Oral Daily Hollice Espy, MD   5 mg at 02/13/17 0954  . losartan (COZAAR) tablet 12.5 mg  12.5 mg Oral Daily Hollice Espy, MD   12.5 mg at 02/13/17 0954  . metFORMIN (GLUCOPHAGE) tablet  500 mg  500 mg Oral BID Hollice Espy, MD   500 mg at 02/13/17 0954  . morphine 2 MG/ML injection 2-4 mg  2-4 mg Intravenous Q2H PRN Hollice Espy, MD   2 mg at 02/12/17 1753  . nitrofurantoin (macrocrystal-monohydrate) (MACROBID) capsule 100 mg  100 mg Oral BID Hollice Espy, MD   100 mg at 02/12/17 2246  . ondansetron (ZOFRAN) injection 4 mg  4 mg Intravenous Q4H PRN Hollice Espy, MD      . opium-belladonna (B&O SUPPRETTES) 16.2-60 MG suppository 1 suppository  1 suppository Rectal Q6H PRN Hollice Espy, MD      . oxybutynin (DITROPAN) tablet 5 mg  5 mg Oral Q8H PRN Hollice Espy, MD   5 mg at 02/13/17 0657  . oxyCODONE-acetaminophen (PERCOCET/ROXICET) 5-325 MG per tablet 1-2 tablet  1-2 tablet Oral Q4H PRN Hollice Espy, MD   2 tablet at 02/12/17 2246  . tamsulosin (FLOMAX) capsule 0.8 mg  0.8 mg Oral Daily Hollice Espy, MD   0.8 mg at 02/13/17 0954     Objective: Vital signs in last 24 hours: Temp:  [97.1 F (36.2 C)-100.1 F (37.8 C)] 98.2 F (36.8 C) (08/07 0524) Pulse Rate:  [81-162] 84 (08/07 0524) Resp:  [15-29] 18 (08/07 0524) BP: (82-118)/(40-66) 107/48 (08/07 0524) SpO2:  [97 %-100 %] 99 % (  08/07 0524)  Intake/Output from previous day: 08/06 0701 - 08/07 0700 In: 4359.6 [P.O.:270; I.V.:4089.6] Out: 2200 [Urine:1950; Blood:250] Intake/Output this shift: Total I/O In: 2000 [P.O.:1600; Other:400] Out: 1750 [Urine:1750]   Physical Exam  Constitutional: He is oriented to person, place, and time and well-developed, well-nourished, and in no distress.  Wife at bedside.  HENT:  Head: Normocephalic and atraumatic.  Cardiovascular: Normal rate and regular rhythm.   Pulmonary/Chest: Effort normal and breath sounds normal.  Abdominal: Soft.  Genitourinary: Penis normal.  Genitourinary Comments: Draining light pink urine. Take her to the left thigh.  Mild penile edema.  Neurological: He is alert and oriented to person, place, and time.  Skin: Skin is warm  and dry.  Psychiatric: Affect normal.    Lab Results:   Recent Labs  02/13/17 0538  WBC 7.1  HGB 7.7*  HCT 23.2*  PLT 127*   BMET  Recent Labs  02/12/17 1406 02/13/17 0538  NA 141 141  K 3.9 3.5  CL 114* 113*  CO2 21* 23  GLUCOSE 233* 134*  BUN 15 16  CREATININE 0.69 0.83  CALCIUM 7.2* 6.9*   Studies/Results: US Renal  Result Date: 02/13/2017 CLINICAL DATA:  Prostatic hypertrophy, lower urinary tract obstruction, gross hematuria, prostate surgery yesterday EXAM: RENAL / URINARY TRACT ULTRASOUND COMPLETE COMPARISON:  CT abdomen pelvis of 01/12/2017 FINDINGS: Right Kidney: Length: 11.5 cm.  No hydronephrosis is seen. Left Kidney: Length: 12.0 cm. There is mild fullness of the left renal pelvocaliceal system. The distal left ureter also with somewhat prominent. Bladder: The urinary bladder is moderately well distended. There is a rounded structure within the bladder without internal blood flow measuring 5.0 x 5.5 x 5.7 cm. This is most consistent with blood clot and it is mobile within the bladder. The Foley catheter is not within the urinary bladder and appears to be expanded within the prostatic urethra. Repositioning of the Foley catheter is recommended The prostate measures 5.6 x 5.1 x 7.8 cm. Incidental finding of a small hemangioma in the right lobe of liver is noted measuring 1.0 cm. IMPRESSION: 1. The Foley catheter balloon appears to be within the prostatic urethra and needs to be repositioned. 2. Rounded filling defect within the urinary bladder consistent with blood clot. No internal blood flow. 3. Mild fullness of the left pelvocaliceal system and left ureter. Electronically Signed   By: Ivar Drape M.D.   On: 02/13/2017 09:52   Renal ultrasound personally reviewed.  Assessment/ Plan:  1. 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  22 French two-way Foley catheter in place, hand irrigated with a few small clots this a.m. Renal ultrasound findings as above. Suspect rounded  filling defects within the bladder most likely residual tissue (reassess with serial ultrasound). Continue Flomax and finasteride upon discharge. Discharge with Foley catheter for at least a week.  2. Mild left renal pelvic fullness- suspect there may be some edema or possibly a low-grade obstruction at the level of the bladder on the left. Overall, urine output is excellent and creatinine remained stable. We will follow this with serial imaging, ordered renal ultrasound for an 1 week prior to follow-up. Voiding symptoms including fevers, or flank pain discussed.  3. Acute blood loss anemia- most likely due to intraoperative bleeding and dilution. Recheck this afternoon, if stable, appropriate for discharge. Encouraged diet with iron containing foods.     LOS: 0 days    Hollice Espy 02/13/2017

## 2017-02-13 NOTE — Discharge Summary (Signed)
Date of admission: 02/12/2017  Date of discharge: 02/13/2017  Admission diagnosis: BPH with urinary retention  Discharge diagnosis: Same as above, acute blood loss anemia  Secondary diagnoses:  Patient Active Problem List   Diagnosis Date Noted  . BPH with urinary obstruction 02/12/2017  . Type 2 diabetes mellitus with microalbuminuria, without long-term current use of insulin ([place]) 01/23/2017  . Hyperlipidemia, unspecified 01/23/2017  . History of colon polyps 01/23/2017  . Elevated prostate specific antigen (PSA) 01/23/2017  . Hematuria 01/12/2017  . Benign prostatic hyperplasia with urinary retention 01/09/2017  . Microalbuminuria 03/29/2015    History and Physical: For full details, please see admission history and physical. Briefly, Lawrence Hart is a 71 y.o. year old patient with Massive BPH admitted for observation following holmium laser enucleation of the prostate.   Hospital Course: Patient tolerated the procedure well.  He was then transferred to the floor after an uneventful PACU stay.  His hospital course was uncomplicated.  Her excellent urine output overnight and his creatinine remained stable. Follow up renal shunt did show some mild left renal pelvic fullness with hydroureter down to the bladder without flank pain. His catheter was repositioned and irrigated. It continued to drain light pink urine. Serial hemoglobins showed no active bleeding.  On POD#1 he had met discharge criteria: was eating a regular diet, was up and ambulating independently,  pain was well controlled,catheter draining well and was ready to for discharge.  He will follow up next week for voiding trial. We'll recheck a renal ultrasound to assess for ongoing or worsening hydronephrosis as well as assess for any possible debris residual within the bladder.  Laboratory values:   Recent Labs  02/13/17 0538 02/13/17 1614  WBC 7.1  --   HGB 7.7* 8.1*  HCT 23.2* 24.1*    Recent Labs   02/12/17 1406 02/13/17 0538  NA 141 141  K 3.9 3.5  CL 114* 113*  CO2 21* 23  GLUCOSE 233* 134*  BUN 15 16  CREATININE 0.69 0.83  CALCIUM 7.2* 6.9*   No results for input(s): LABPT, INR in the last 72 hours. No results for input(s): LABURIN in the last 72 hours. Results for orders placed or performed during the hospital encounter of 01/30/17  Urine culture     Status: Abnormal   Collection Time: 01/30/17 10:24 AM  Result Value Ref Range Status   Specimen Description URINE, CLEAN CATCH  Final   Special Requests NONE  Final   Culture (A)  Final    80,000 COLONIES/mL ENTEROCOCCUS FAECALIS 10,000 COLONIES/mL KLEBSIELLA OXYTOCA    Report Status 02/01/2017 FINAL  Final   Organism ID, Bacteria ENTEROCOCCUS FAECALIS (A)  Final   Organism ID, Bacteria KLEBSIELLA OXYTOCA (A)  Final      Susceptibility   Enterococcus faecalis - MIC*    AMPICILLIN <=2 SENSITIVE Sensitive     LEVOFLOXACIN 1 SENSITIVE Sensitive     NITROFURANTOIN <=16 SENSITIVE Sensitive     VANCOMYCIN 1 SENSITIVE Sensitive     * 80,000 COLONIES/mL ENTEROCOCCUS FAECALIS   Klebsiella oxytoca - MIC*    AMPICILLIN >=32 RESISTANT Resistant     CEFAZOLIN <=4 SENSITIVE Sensitive     CEFTRIAXONE <=1 SENSITIVE Sensitive     CIPROFLOXACIN <=0.25 SENSITIVE Sensitive     GENTAMICIN <=1 SENSITIVE Sensitive     IMIPENEM <=0.25 SENSITIVE Sensitive     NITROFURANTOIN <=16 SENSITIVE Sensitive     TRIMETH/SULFA <=20 SENSITIVE Sensitive     AMPICILLIN/SULBACTAM 8 SENSITIVE Sensitive  PIP/TAZO <=4 SENSITIVE Sensitive     Extended ESBL NEGATIVE Sensitive     * 10,000 COLONIES/mL KLEBSIELLA OXYTOCA    Disposition: Home  Discharge instruction: Routine Foley catheter care  Discharge medications: Allergies as of 02/13/2017      Reactions   Other    NIGHT SHADE PLANTS / INDIGESTION Bell peppers      Medication List    TAKE these medications   acetaminophen 500 MG tablet Commonly known as:  TYLENOL Take 1,000 mg by mouth  every 8 (eight) hours as needed.   atorvastatin 10 MG tablet Commonly known as:  LIPITOR Take 10 mg by mouth every evening.   CALCIUM 600 + D PO Take 1 tablet by mouth daily with supper.   docusate sodium 100 MG capsule Commonly known as:  COLACE Take 1 capsule (100 mg total) by mouth 2 (two) times daily as needed (take to keep stool soft.).   feeding supplement (GLUCERNA SHAKE) Liqd Take 237 mLs by mouth 2 (two) times daily between meals.   finasteride 5 MG tablet Commonly known as:  PROSCAR Take 1 tablet (5 mg total) by mouth daily. What changed:  Another medication with the same name was added. Make sure you understand how and when to take each.   finasteride 5 MG tablet Commonly known as:  PROSCAR Take 1 tablet (5 mg total) by mouth daily. What changed:  You were already taking a medication with the same name, and this prescription was added. Make sure you understand how and when to take each.   HYDROcodone-acetaminophen 5-325 MG tablet Commonly known as:  NORCO/VICODIN Take 1-2 tablets by mouth every 6 (six) hours as needed for moderate pain.   hydrocortisone cream 1 % Apply 1 application topically as needed for itching.   losartan 25 MG tablet Commonly known as:  COZAAR Take 12.5 mg by mouth daily.   metFORMIN 500 MG tablet Commonly known as:  GLUCOPHAGE Take 500 mg by mouth 2 (two) times daily.   multivitamin with minerals Tabs tablet Take 1 tablet by mouth daily.   nitrofurantoin (macrocrystal-monohydrate) 100 MG capsule Commonly known as:  MACROBID Take 1 capsule (100 mg total) by mouth 2 (two) times daily.   oxybutynin 5 MG tablet Commonly known as:  DITROPAN Take 1 tablet (5 mg total) by mouth every 8 (eight) hours as needed for bladder spasms.   tamsulosin 0.4 MG Caps capsule Commonly known as:  FLOMAX Take 2 capsules (0.8 mg total) by mouth daily.   vitamin C 500 MG tablet Commonly known as:  ASCORBIC ACID Take 500 mg by mouth daily with  supper.       Followup:  Follow-up Information    Hollice Espy, MD In 1 week.   Specialty:  Urology Why:  For follow up with renal ultrasound prior to visit Contact information: [address] [place] [address] [phone]

## 2017-02-13 NOTE — ED Notes (Signed)
MD at bedside. 

## 2017-02-13 NOTE — Progress Notes (Signed)
Patient discharged to home as ordered, follow up appointment and prescription given as ordered. Patient denies pain at this time IV discontinued site clean dry and intact. No acute distress noted.

## 2017-02-13 NOTE — Care Management Obs Status (Signed)
[place] NOTIFICATION   Patient Details  Name: Lawrence Hart MRN: [id number] Date of Birth: 05/23/1946   Medicare Observation Status Notification Given:  Yes    Beverly Sessions, RN 02/13/2017, 2:32 PM

## 2017-02-13 NOTE — ED Provider Notes (Signed)
[facility] Emergency Department Provider Note  Time seen: 11:03 PM  I have reviewed the triage vital signs and the nursing notes.   HISTORY  Chief Complaint Post-op Problem    HPI Lawrence Hart is a 71 y.o. male with a past medical history diabetes, hypertension, recent prostate surgery performed yesterday discharge earlier today presents to the emergency department with a clogged Foley catheter. According to the patient he has been having blood in his urine since the procedure which he was told is normal. He was getting clots out this morning while in the hospital. Upon going home the Foley catheter stop draining he was able to irrigate at home and remove several clots could not get the Foley catheter flowing again so he came to the ER for evaluation. States mild lower abdominal discomfort. Denies fever, nausea or vomiting.  Past Medical History:  Diagnosis Date  . Diabetes mellitus without complication ([place])   . Foley catheter in place   . Hypertension   . Prostate hypertrophy     Patient Active Problem List   Diagnosis Date Noted  . BPH with urinary obstruction 02/12/2017  . Type 2 diabetes mellitus with microalbuminuria, without long-term current use of insulin ([place]) 01/23/2017  . Hyperlipidemia, unspecified 01/23/2017  . History of colon polyps 01/23/2017  . Elevated prostate specific antigen (PSA) 01/23/2017  . Hematuria 01/12/2017  . Benign prostatic hyperplasia with urinary retention 01/09/2017  . Microalbuminuria 03/29/2015    Past Surgical History:  Procedure Laterality Date  . COLONOSCOPY    . dental implants    . HOLEP-LASER ENUCLEATION OF THE PROSTATE WITH MORCELLATION N/A 02/12/2017   Procedure: HOLEP-LASER ENUCLEATION OF THE PROSTATE WITH MORCELLATION;  Surgeon: Hollice Espy, MD;  Location: [facility] ORS;  Service: Urology;  Laterality: N/A;  . HOLMIUM LASER APPLICATION N/A 0/03/6044   Procedure: HOLMIUM LASER APPLICATION;  Surgeon:  Hollice Espy, MD;  Location: [facility] ORS;  Service: Urology;  Laterality: N/A;  . MOUTH SURGERY    . none      Prior to Admission medications   Medication Sig Start Date End Date Taking? Authorizing Provider  acetaminophen (TYLENOL) 500 MG tablet Take 1,000 mg by mouth every 8 (eight) hours as needed.   Yes [provider]  atorvastatin (LIPITOR) 10 MG tablet Take 10 mg by mouth every evening.   Yes [provider]  Calcium Carb-Cholecalciferol (CALCIUM 600 + D PO) Take 1 tablet by mouth daily with supper.   Yes [provider]  docusate sodium (COLACE) 100 MG capsule Take 1 capsule (100 mg total) by mouth 2 (two) times daily as needed (take to keep stool soft.). 02/13/17  Yes Hollice Espy, MD  feeding supplement, GLUCERNA SHAKE, (GLUCERNA SHAKE) LIQD Take 237 mLs by mouth 2 (two) times daily between meals.   Yes [provider]  finasteride (PROSCAR) 5 MG tablet Take 1 tablet (5 mg total) by mouth daily. 01/14/17  Yes Wieting, Richard, MD  finasteride (PROSCAR) 5 MG tablet Take 1 tablet (5 mg total) by mouth daily. 02/13/17 02/13/18 Yes Hollice Espy, MD  glimepiride (AMARYL) 1 MG tablet Take 1 tablet by mouth daily. 02/02/17  Yes [provider]  HYDROcodone-acetaminophen (NORCO/VICODIN) 5-325 MG tablet Take 1-2 tablets by mouth every 6 (six) hours as needed for moderate pain. 02/13/17  Yes Hollice Espy, MD  hydrocortisone cream 1 % Apply 1 application topically as needed for itching.   Yes [provider]  losartan (COZAAR) 25 MG tablet Take 12.5 mg  by mouth daily.    Yes [provider]  metFORMIN (GLUCOPHAGE) 500 MG tablet Take 500 mg by mouth 2 (two) times daily.   Yes [provider]  Multiple Vitamin (MULTIVITAMIN WITH MINERALS) TABS tablet Take 1 tablet by mouth daily.   Yes [provider]  nitrofurantoin, macrocrystal-monohydrate, (MACROBID) 100 MG capsule Take 1 capsule (100 mg total) by mouth 2 (two) times  daily. 02/01/17  Yes Hollice Espy, MD  oxybutynin (DITROPAN) 5 MG tablet Take 1 tablet (5 mg total) by mouth every 8 (eight) hours as needed for bladder spasms. 02/13/17  Yes Hollice Espy, MD  tamsulosin (FLOMAX) 0.4 MG CAPS capsule Take 2 capsules (0.8 mg total) by mouth daily. 01/08/17  Yes MacDiarmid, Nicki Reaper, MD  vitamin C (ASCORBIC ACID) 500 MG tablet Take 500 mg by mouth daily with supper.   Yes [provider]    Allergies  Allergen Reactions  . Other     NIGHT SHADE PLANTS / INDIGESTION Bell peppers    Family History  Problem Relation Age of Onset  . Kidney disease Mother   . Breast cancer Mother   . Heart failure Mother   . Heart disease Father   . Prostate cancer Neg Hx   . Bladder Cancer Neg Hx   . Kidney cancer Neg Hx     Social History Social History  Substance Use Topics  . Smoking status: Former Research scientist (life sciences)  . Smokeless tobacco: Never Used  . Alcohol use Yes     Comment: occassionally    Review of Systems Constitutional: Negative for fever. Cardiovascular: Negative for chest pain. Respiratory: Negative for shortness of breath. Gastrointestinal:Mild lower abdominal pain. Negative for nausea vomiting or diarrhea Genitourinary: Positive for hematuria. Decreased drainage from Foley. Neurological: Negative for headache All other ROS negative  ____________________________________________   PHYSICAL EXAM:  VITAL SIGNS: ED Triage Vitals  Enc Vitals Group     BP 02/13/17 2144 (!) 125/54     Pulse Rate 02/13/17 2144 (!) 112     Resp 02/13/17 2144 18     Temp 02/13/17 2144 98.9 F (37.2 C)     Temp Source 02/13/17 2144 Oral     SpO2 02/13/17 2144 99 %     Weight 02/13/17 2143 185 lb (83.9 kg)     Height 02/13/17 2143 6' (1.829 m)     Head Circumference --      Peak Flow --      Pain Score 02/13/17 2148 2     Pain Loc --      Pain Edu? --      Excl. in [place]? --     Constitutional: Alert and oriented. Well appearing and in no distress. Eyes:  Normal exam ENT   Head: Normocephalic and atraumatic.   Mouth/Throat: Mucous membranes are moist. Cardiovascular: Normal rate, regular rhythm. Respiratory: Normal respiratory effort without tachypnea nor retractions. Breath sounds are clear  Gastrointestinal: Mild suprapubic tenderness. No rebound or guarding. There is Musculoskeletal: Nontender with normal range of motion in all extremities.  Neurologic:  Normal speech and language. No gross focal neurologic deficits  Skin:  Skin is warm, dry and intact.  Psychiatric: Mood and affect are normal.   ____________________________________________  INITIAL IMPRESSION / ASSESSMENT AND PLAN / ED COURSE  Pertinent labs & imaging results that were available during my care of the patient were reviewed by me and considered in my medical decision making (see chart for details).  Patient presents with a clogged Foley catheter. I  was able to irrigate with sterile water. We were able to remove several large clots. The fall catheter is now flowing freely. Bedside ultrasound shows no appreciable fluid within the bladder at this time. Patient states no increased discomfort in the lower abdomen. We will discharge home  ____________________________________________   FINAL CLINICAL IMPRESSION(S) / ED DIAGNOSES  Clogged Foley catheter    Harvest Dark, MD 02/13/17 2305

## 2017-02-13 NOTE — ED Notes (Addendum)
[place] at bedside, MD notified

## 2017-02-13 NOTE — ED Triage Notes (Signed)
Pt had prostate surgery yesterday and was released from [place] yesterday  Pt has foley cath in place. Pt states he is passing blood clots and tried to irrigate.  Blood noted in foley bag.  Pt alert.

## 2017-02-13 NOTE — ED Notes (Signed)
Bladder Scan Completed: 97 ml in bladder.

## 2017-02-14 ENCOUNTER — Telehealth: Payer: Self-pay | Admitting: Urology

## 2017-02-14 NOTE — Telephone Encounter (Signed)
-----   Message from Hollice Espy, MD sent at 02/13/2017  5:01 PM EDT ----- Regarding: f/u plan I would like to see this patient follow-up in 1 week with me for a voiding trial. I also ordered a renal ultrasound which I would like performed 1 day prior to our visit.  Hollice Espy, MD

## 2017-02-14 NOTE — Telephone Encounter (Signed)
Done They have been trying to call patient to schedule his [place]

## 2017-02-15 LAB — SURGICAL PATHOLOGY

## 2017-02-20 ENCOUNTER — Ambulatory Visit
Admission: RE | Admit: 2017-02-20 | Discharge: 2017-02-20 | Disposition: A | Discharge status: Home / Self Care | Payer: PPO | Source: Ambulatory Visit | Attending: Urology | Admitting: Urology

## 2017-02-20 DIAGNOSIS — N1330 Unspecified hydronephrosis: Secondary | ICD-10-CM | POA: Diagnosis not present

## 2017-02-20 DIAGNOSIS — N138 Other obstructive and reflux uropathy: Secondary | ICD-10-CM | POA: Diagnosis not present

## 2017-02-20 DIAGNOSIS — N401 Enlarged prostate with lower urinary tract symptoms: Secondary | ICD-10-CM | POA: Insufficient documentation

## 2017-02-21 ENCOUNTER — Ambulatory Visit (INDEPENDENT_AMBULATORY_CARE_PROVIDER_SITE_OTHER): Payer: PPO | Admitting: Urology

## 2017-02-21 ENCOUNTER — Encounter: Payer: Self-pay | Admitting: Urology

## 2017-02-21 VITALS — BP 153/70 | HR 103 | Ht 72.0 in | Wt 185.0 lb

## 2017-02-21 DIAGNOSIS — R310 Gross hematuria: Secondary | ICD-10-CM

## 2017-02-21 DIAGNOSIS — N1330 Unspecified hydronephrosis: Secondary | ICD-10-CM

## 2017-02-21 DIAGNOSIS — N401 Enlarged prostate with lower urinary tract symptoms: Secondary | ICD-10-CM

## 2017-02-21 DIAGNOSIS — R338 Other retention of urine: Secondary | ICD-10-CM

## 2017-02-21 NOTE — Progress Notes (Signed)
02/21/2017 10:21 AM   Lawrence Hart [birth year] [id number]  Referring provider: Leonel Ramsay, MD [place], [place] [address]  Chief Complaint  Patient presents with  . Follow-up    HPI: 71 year old male who presents today to discuss management of his massive BPH and urinary retention s/p HoLEP on 02/12/17.  He was seen in the emergency room 1 for difficulty with his Foley catheter which is able to be flushed. Since then, it has drained without issues.    He was admitted postop for observation. Intraoperatively, ureteral orifices were not identified. He did have mild postoperative left hydronephrosis but excellent urine output and a normal creatinine. Follow up renal ultrasound yesterday shows improvement in his hydronephrosis. He denies flank pain.  Surgical pathology consistent with benign prostatic glandular and stromal hyperplasia as well as chronic prostatitis and urethritis. Total resection volume 145 g in formalin.  Foley remained in place until today and successfully passed a voiding trial.  He continues on finasteride and Flomax both started at the time of retention.  Calculated prostate volume approximately 300 cc on preop CT scan.  He did undergo a previous negative prostate biopsy proximal to 10 years ago while living in a different state.  Most recent PSA 5.88 on 03/02/16.  Prior to this, it was high as 10. Most recent rectal exam by Dr. Nicki Reaper MacDiarmid unremarkable other than for massive enlargement.     PMH: Past Medical History:  Diagnosis Date  . Diabetes mellitus without complication ([place])   . Foley catheter in place   . Hypertension   . Prostate hypertrophy     Surgical History: Past Surgical History:  Procedure Laterality Date  . COLONOSCOPY    . dental implants    . HOLEP-LASER ENUCLEATION OF THE PROSTATE WITH MORCELLATION N/A 02/12/2017   Procedure: HOLEP-LASER ENUCLEATION OF THE PROSTATE WITH MORCELLATION;  Surgeon:  Hollice Espy, MD;  Location: [facility] ORS;  Service: Urology;  Laterality: N/A;  . HOLMIUM LASER APPLICATION N/A 08/13/5571   Procedure: HOLMIUM LASER APPLICATION;  Surgeon: Hollice Espy, MD;  Location: [facility] ORS;  Service: Urology;  Laterality: N/A;  . MOUTH SURGERY    . none      Home Medications:  Allergies as of 02/21/2017      Reactions   Other    NIGHT SHADE PLANTS / INDIGESTION Bell peppers      Medication List       Accurate as of 02/21/17 10:21 AM. Always use your most recent med list.          acetaminophen 500 MG tablet Commonly known as:  TYLENOL Take 1,000 mg by mouth every 8 (eight) hours as needed.   atorvastatin 10 MG tablet Commonly known as:  LIPITOR Take 10 mg by mouth every evening.   CALCIUM 600 + D PO Take 1 tablet by mouth daily with supper.   docusate sodium 100 MG capsule Commonly known as:  COLACE Take 1 capsule (100 mg total) by mouth 2 (two) times daily as needed (take to keep stool soft.).   feeding supplement (GLUCERNA SHAKE) Liqd Take 237 mLs by mouth 2 (two) times daily between meals.   finasteride 5 MG tablet Commonly known as:  PROSCAR Take 1 tablet (5 mg total) by mouth daily.   glimepiride 1 MG tablet Commonly known as:  AMARYL Take 1 tablet by mouth daily.   hydrocortisone cream 1 % Apply 1 application topically as needed for itching.   losartan 25 MG tablet  Commonly known as:  COZAAR Take 12.5 mg by mouth daily.   metFORMIN 500 MG tablet Commonly known as:  GLUCOPHAGE Take 500 mg by mouth 2 (two) times daily.   multivitamin with minerals Tabs tablet Take 1 tablet by mouth daily.   tamsulosin 0.4 MG Caps capsule Commonly known as:  FLOMAX Take 2 capsules (0.8 mg total) by mouth daily.   vitamin C 500 MG tablet Commonly known as:  ASCORBIC ACID Take 500 mg by mouth daily with supper.       Allergies:  Allergies  Allergen Reactions  . Other     NIGHT SHADE PLANTS / INDIGESTION Bell peppers    Family  History: Family History  Problem Relation Age of Onset  . Kidney disease Mother   . Breast cancer Mother   . Heart failure Mother   . Heart disease Father   . Prostate cancer Neg Hx   . Bladder Cancer Neg Hx   . Kidney cancer Neg Hx     Social History:  reports that he has quit smoking. He has never used smokeless tobacco. He reports that he drinks alcohol. He reports that he does not use drugs.  ROS: UROLOGY Frequent Urination?: No Hard to postpone urination?: No Burning/pain with urination?: No Get up at night to urinate?: No Leakage of urine?: No Urine stream starts and stops?: No Trouble starting stream?: No Do you have to strain to urinate?: No Blood in urine?: No Urinary tract infection?: No Sexually transmitted disease?: No Injury to kidneys or bladder?: No Painful intercourse?: No Weak stream?: No Erection problems?: No Penile pain?: No  Gastrointestinal Nausea?: No Vomiting?: No Indigestion/heartburn?: No Diarrhea?: No Constipation?: No  Constitutional Fever: No Night sweats?: No Weight loss?: No Fatigue?: No  Skin Skin rash/lesions?: No Itching?: No  Eyes Blurred vision?: No Double vision?: No  Ears/Nose/Throat Sore throat?: No Sinus problems?: No  Hematologic/Lymphatic Swollen glands?: No Easy bruising?: No  Cardiovascular Leg swelling?: No Chest pain?: No  Respiratory Cough?: No Shortness of breath?: No  Endocrine Excessive thirst?: No  Musculoskeletal Back pain?: No Joint pain?: No  Neurological Headaches?: No Dizziness?: No  Psychologic Depression?: No Anxiety?: No  Physical Exam: BP (!) 153/70 (BP Location: Left Arm, Patient Position: Sitting, Cuff Size: Large)   Pulse (!) 103   Ht 6' (1.829 m)   Wt 185 lb (83.9 kg)   BMI 25.09 kg/m   Constitutional:  Alert and oriented, No acute distress.  Accompanied by wife today. HEENT: [place] AT, moist mucus membranes.  Trachea midline, no masses. Cardiovascular: No  clubbing, cyanosis, bilateral lower extremity edema, right greater than left. Respiratory: Normal respiratory effort, no increased work of breathing. GI: Abdomen is soft, nontender, nondistended, no abdominal masses GU: Minimal urethral erosion status post successful voiding trial. Urine clear yellow in urinal.  No CVA tenderness.   Skin: No rashes, bruises or suspicious lesions. Neurologic: Grossly intact, no focal deficits, moving all 4 extremities. Psychiatric: Normal mood and affect.  Laboratory Data: Lab Results  Component Value Date   WBC 7.1 02/13/2017   HGB 8.1 (L) 02/13/2017   HCT 24.1 (L) 02/13/2017   MCV 77.7 (L) 02/13/2017   PLT 127 (L) 02/13/2017    Lab Results  Component Value Date   CREATININE 0.83 02/13/2017    Urinalysis N/a  Pertinent Imaging: CLINICAL DATA:  BPH, lower urinary tract obstructive symptoms. Follow-up of left-sided hydronephrosis. Status post laser enucleation of the prostate gland on February 12, 2017  EXAM: RENAL /  URINARY TRACT ULTRASOUND COMPLETE  COMPARISON:  Urinary tract ultrasound of February 13, 2017  FINDINGS: Right Kidney:  Length: 11.6 cm. Echogenicity within normal limits. No mass or hydronephrosis visualized.  Left Kidney:  Length: 13.4 cm. There is mild splitting of the central echo complex of the left kidney. The renal cortical echotexture is normal.  Bladder:  The urinary bladder is decompressed. A Foley catheter balloon is visible within the collapsed lumen of the bladder.  IMPRESSION: Mild left-sided hydronephrosis slightly less conspicuous than on the previous study. No right-sided hydronephrosis.  Decompressed urinary bladder with Foley catheter present.   Electronically Signed   By: David  Martinique M.D.   On: 02/20/2017 12:29  RUS personally reviewed today  Assessment & Plan:    1. Benign prostatic hyperplasia with urinary retention Massive BPH, partly 300 cc prostate with urinary retention  and failed voiding trials  S/p HoLEP  Successful voiding trial today Into any Flomax and finasteride at this time, will reassess in one month and slowly takeoff BPH meds  2. Gross hematuria Resolved  3. Left hydronephrosis Suspect related to edema at the ureteral orifice, improving after 1 week Asymptomatic We'll follow-up in 1 month with a repeat renal ultrasound to ensure resolution  Return in about 4 weeks (around 03/21/2017) for RUS, IPSS, PVR.  Hollice Espy, MD  [facility] [address] [place], [place] [address] [phone] \

## 2017-02-21 NOTE — Progress Notes (Signed)
Fill and Pull Catheter Removal  Patient is present today for a catheter removal.  Patient was cleaned and prepped in a sterile fashion 280 ml of sterile water/ saline was instilled into the bladder when the patient felt the urge to urinate. 42ml of water was then drained from the balloon.  A 22FR foley cath was removed from the bladder no complications were noted .  Patient as then given some time to void on their own.  He was barely able to void all of the irrigant instilled into the bladder, slight leakage..  Patient tolerated well.  Preformed by: Cammie Mcgee. cma

## 2017-03-13 ENCOUNTER — Encounter: Payer: Self-pay | Admitting: Family Medicine

## 2017-03-13 ENCOUNTER — Ambulatory Visit (INDEPENDENT_AMBULATORY_CARE_PROVIDER_SITE_OTHER): Payer: PPO | Admitting: Family Medicine

## 2017-03-13 VITALS — BP 137/65 | HR 109 | Ht 72.0 in | Wt 190.3 lb

## 2017-03-13 DIAGNOSIS — R338 Other retention of urine: Secondary | ICD-10-CM

## 2017-03-13 DIAGNOSIS — N401 Enlarged prostate with lower urinary tract symptoms: Secondary | ICD-10-CM | POA: Diagnosis not present

## 2017-03-13 LAB — BLADDER SCAN AMB NON-IMAGING: Scan Result: >494

## 2017-03-13 NOTE — Progress Notes (Signed)
Simple Catheter Placement  Due to urinary retention patient is present today for a foley cath placement.  Patient was cleaned and prepped in a sterile fashion with betadine and lidocaine jelly 2% was instilled into the urethra.  A 20 FR Coude catheter was inserted, urine return was noted  45ml, urine was Dark red in color.  The balloon was filled with 10cc of sterile water.  A leg bag was attached for drainage. Patient was also given a night bag to take home and was given instruction on how to change from one bag to another.  Patient was given instruction on proper catheter care.  Patient tolerated well, no complications were noted   Preformed by: Elberta Leatherwood, CMA  Additional notes/ Follow up: Fill and pull 2 weeks

## 2017-03-14 ENCOUNTER — Ambulatory Visit (INDEPENDENT_AMBULATORY_CARE_PROVIDER_SITE_OTHER): Payer: PPO

## 2017-03-14 VITALS — BP 130/73 | HR 101 | Ht 72.0 in | Wt 187.8 lb

## 2017-03-14 DIAGNOSIS — R310 Gross hematuria: Secondary | ICD-10-CM

## 2017-03-14 LAB — URINALYSIS, COMPLETE
BILIRUBIN UA: NEGATIVE
Glucose, UA: NEGATIVE
Ketones, UA: NEGATIVE
Nitrite, UA: NEGATIVE
PH UA: 5.5 (ref 5.0–7.5)
Specific Gravity, UA: <1.005 — ABNORMAL LOW (ref 1.005–1.030)
Urobilinogen, Ur: 0.2 mg/dL (ref 0.2–1.0)

## 2017-03-14 LAB — MICROSCOPIC EXAMINATION: EPITHELIAL CELLS (NON RENAL): NONE SEEN /HPF (ref 0–10)

## 2017-03-14 MED ORDER — SULFAMETHOXAZOLE-TRIMETHOPRIM 800-160 MG PO TABS: 1.0000 | ORAL_TABLET | Freq: Two times a day (BID) | ORAL | 0 refills | Status: DC

## 2017-03-14 NOTE — Progress Notes (Signed)
Bladder Irrigation  Due to clot retention patient is present today for a bladder irrigation. Patient was cleaned and prepped in a sterile fashion. 210 ml of saline/sterile water was instilled and irrigated into the bladder with a 39ml [place]y syringe through the catheter in place.  After irrigation urine flow was noted no complications were noted catheter is now draining fine.  Catheter was reattached to the leg bag for drainage. Patient tolerated well.   Preformed by: Toniann Fail, LPN  Additional notes/ Follow up: Pt called stating his foley had not drained since 2am. Once pt arrived at clinic foley had started draining on to way over. Urine was cloudy yellow. Irrigation was performed. Sample was collected for u/a and cx. Per Dr. Erlene Quan bactrim ds bid x7days was sent to pharmacy. Pt has a f/u next week with Dr. Erlene Quan.   Blood pressure 130/73, pulse (!) 101, height 6' (1.829 m), weight 187 lb 12.8 oz (85.2 kg).

## 2017-03-16 DIAGNOSIS — L821 Other seborrheic keratosis: Secondary | ICD-10-CM | POA: Diagnosis not present

## 2017-03-16 DIAGNOSIS — D225 Melanocytic nevi of trunk: Secondary | ICD-10-CM | POA: Diagnosis not present

## 2017-03-17 LAB — CULTURE, URINE COMPREHENSIVE

## 2017-03-19 ENCOUNTER — Telehealth: Payer: Self-pay

## 2017-03-19 ENCOUNTER — Ambulatory Visit
Admission: RE | Admit: 2017-03-19 | Discharge: 2017-03-19 | Disposition: A | Discharge status: Home / Self Care | Payer: PPO | Source: Ambulatory Visit | Attending: Urology | Admitting: Urology

## 2017-03-19 DIAGNOSIS — N401 Enlarged prostate with lower urinary tract symptoms: Secondary | ICD-10-CM | POA: Diagnosis not present

## 2017-03-19 DIAGNOSIS — R338 Other retention of urine: Secondary | ICD-10-CM | POA: Diagnosis not present

## 2017-03-19 DIAGNOSIS — N1330 Unspecified hydronephrosis: Secondary | ICD-10-CM | POA: Diagnosis not present

## 2017-03-19 NOTE — Telephone Encounter (Signed)
Left pt mess to call 

## 2017-03-19 NOTE — Telephone Encounter (Signed)
-----   Message from Hollice Espy, MD sent at 03/18/2017  4:10 PM EDT ----- + UTI with Klebsiella.  Bactrim should be effective.    Hollice Espy, MD

## 2017-03-20 ENCOUNTER — Ambulatory Visit (INDEPENDENT_AMBULATORY_CARE_PROVIDER_SITE_OTHER): Payer: PPO | Admitting: Urology

## 2017-03-20 ENCOUNTER — Encounter: Payer: Self-pay | Admitting: Urology

## 2017-03-20 VITALS — BP 133/69 | HR 103 | Temp 98.3°F | Ht 72.0 in | Wt 185.0 lb

## 2017-03-20 DIAGNOSIS — N401 Enlarged prostate with lower urinary tract symptoms: Secondary | ICD-10-CM

## 2017-03-20 DIAGNOSIS — R310 Gross hematuria: Secondary | ICD-10-CM

## 2017-03-20 DIAGNOSIS — R338 Other retention of urine: Secondary | ICD-10-CM | POA: Diagnosis not present

## 2017-03-20 DIAGNOSIS — N1330 Unspecified hydronephrosis: Secondary | ICD-10-CM

## 2017-03-20 MED ORDER — SULFAMETHOXAZOLE-TRIMETHOPRIM 800-160 MG PO TABS: 1.0000 | ORAL_TABLET | Freq: Two times a day (BID) | ORAL | 0 refills | Status: AC

## 2017-03-20 NOTE — Progress Notes (Signed)
03/20/2017 2:10 PM   Lawrence Hart 1945/09/11 [id number]  Referring provider: Leonel Ramsay, MD [place], [place] [address]  Chief Complaint  Patient presents with  . Hematuria    HPI:   71 year old male who presents today to discuss management of his massive BPH and urinary retention s/p HoLEP on 02/12/17.  His postoperative course has been complicated by urinary retention with clots status post Foley catheter placement on 03/14/2017. Urine culture grew Klebsiella and he was appropriately treated with Bactrim.  His Foley remained in place today.  He does complain of irritation from the catheter is anxious to have it removed. Urine is now clear.  Immediately postop, he did have some left hydronephrosis which appears to result on most recent renal ultrasound.    Surgical pathology consistent with benign prostatic glandular and stromal hyperplasia as well as chronic prostatitis and urethritis. Total resection volume 145 g in formalin.  He continues on finasteride and Flomax both started at the time of retention.  Calculated prostate volume approximately 300 cc on preop CT scan.  He did undergo a previous negative prostate biopsy proximal to 10 years ago while living in a different state.  Most recent PSA 5.88 on 03/02/16.  Prior to this, it was high as 10. Most recent rectal exam by Dr. Nicki Reaper MacDiarmid unremarkable other than for massive enlargement.     PMH: Past Medical History:  Diagnosis Date  . Diabetes mellitus without complication ([place])   . Foley catheter in place   . Hypertension   . Prostate hypertrophy     Surgical History: Past Surgical History:  Procedure Laterality Date  . COLONOSCOPY    . dental implants    . HOLEP-LASER ENUCLEATION OF THE PROSTATE WITH MORCELLATION N/A 02/12/2017   Procedure: HOLEP-LASER ENUCLEATION OF THE PROSTATE WITH MORCELLATION;  Surgeon: Hollice Espy, MD;  Location: [facility] ORS;  Service: Urology;   Laterality: N/A;  . HOLMIUM LASER APPLICATION N/A 10/09/7060   Procedure: HOLMIUM LASER APPLICATION;  Surgeon: Hollice Espy, MD;  Location: [facility] ORS;  Service: Urology;  Laterality: N/A;  . MOUTH SURGERY    . none      Home Medications:  Allergies as of 03/20/2017      Reactions   Other Nausea Only   NIGHT SHADE PLANTS / INDIGESTION Bell peppers      Medication List       Accurate as of 03/20/17  2:10 PM. Always use your most recent med list.          acetaminophen 500 MG tablet Commonly known as:  TYLENOL Take 1,000 mg by mouth every 8 (eight) hours as needed.   atorvastatin 10 MG tablet Commonly known as:  LIPITOR Take 10 mg by mouth every evening.   CALCIUM 600 + D PO Take 1 tablet by mouth daily with supper.   docusate sodium 100 MG capsule Commonly known as:  COLACE Take 1 capsule (100 mg total) by mouth 2 (two) times daily as needed (take to keep stool soft.).   feeding supplement (GLUCERNA SHAKE) Liqd Take 237 mLs by mouth 2 (two) times daily between meals.   finasteride 5 MG tablet Commonly known as:  PROSCAR Take 1 tablet (5 mg total) by mouth daily.   glimepiride 1 MG tablet Commonly known as:  AMARYL Take 1 tablet by mouth daily.   hydrocortisone cream 1 % Apply 1 application topically as needed for itching.   losartan 25 MG tablet Commonly known as:  COZAAR  Take 12.5 mg by mouth daily.   metFORMIN 500 MG tablet Commonly known as:  GLUCOPHAGE Take 500 mg by mouth 2 (two) times daily.   multivitamin with minerals Tabs tablet Take 1 tablet by mouth daily.   sulfamethoxazole-trimethoprim 800-160 MG tablet Commonly known as:  BACTRIM DS,SEPTRA DS Take 1 tablet by mouth 2 (two) times daily.   tamsulosin 0.4 MG Caps capsule Commonly known as:  FLOMAX Take 2 capsules (0.8 mg total) by mouth daily.   vitamin C 500 MG tablet Commonly known as:  ASCORBIC ACID Take 500 mg by mouth daily with supper.       Allergies:  Allergies  Allergen  Reactions  . Other Nausea Only    NIGHT SHADE PLANTS / INDIGESTION Bell peppers    Family History: Family History  Problem Relation Age of Onset  . Kidney disease Mother   . Breast cancer Mother   . Heart failure Mother   . Heart disease Father   . Prostate cancer Neg Hx   . Bladder Cancer Neg Hx   . Kidney cancer Neg Hx     Social History:  reports that he has quit smoking. He has never used smokeless tobacco. He reports that he drinks alcohol. He reports that he does not use drugs.  ROS: UROLOGY Frequent Urination?: No Hard to postpone urination?: No Burning/pain with urination?: No Get up at night to urinate?: No Leakage of urine?: No Urine stream starts and stops?: No Trouble starting stream?: No Do you have to strain to urinate?: No Blood in urine?: No Urinary tract infection?: No Sexually transmitted disease?: No Injury to kidneys or bladder?: No Painful intercourse?: No Weak stream?: No Erection problems?: No Penile pain?: Yes  Gastrointestinal Nausea?: No Vomiting?: No Indigestion/heartburn?: No Diarrhea?: No Constipation?: No  Constitutional Fever: No Night sweats?: No Weight loss?: No Fatigue?: No  Skin Skin rash/lesions?: No Itching?: No  Eyes Blurred vision?: No Double vision?: No  Ears/Nose/Throat Sore throat?: No Sinus problems?: No  Hematologic/Lymphatic Swollen glands?: No Easy bruising?: No  Cardiovascular Leg swelling?: No Chest pain?: No  Respiratory Cough?: No Shortness of breath?: No  Endocrine Excessive thirst?: No  Musculoskeletal Back pain?: No Joint pain?: No  Neurological Headaches?: No Dizziness?: No  Psychologic Depression?: No Anxiety?: No  Physical Exam: BP 133/69   Pulse (!) 103   Temp 98.3 F (36.8 C) (Oral)   Ht 6' (1.829 m)   Wt 185 lb (83.9 kg)   BMI 25.09 kg/m   Constitutional:  Alert and oriented, No acute distress.  Accompanied by wife today. HEENT: [place] AT, moist mucus membranes.   Trachea midline, no masses. Cardiovascular: No clubbing, cyanosis, or edema. Respiratory: Normal respiratory effort, no increased work of breathing. GI: Abdomen is soft, nontender, nondistended, no abdominal masses GU: Foley catheter in place draining clear yellow urine.  After voiding trial, slightly cloudy urine with debris noted with large 1 cm necrotic presumed prostate tissue voided. Skin: No rashes, bruises or suspicious lesions. Neurologic: Grossly intact, no focal deficits, moving all 4 extremities. Psychiatric: Normal mood and affect.  Laboratory Data: Lab Results  Component Value Date   WBC 7.1 02/13/2017   HGB 8.1 (L) 02/13/2017   HCT 24.1 (L) 02/13/2017   MCV 77.7 (L) 02/13/2017   PLT 127 (L) 02/13/2017    Lab Results  Component Value Date   CREATININE 0.83 02/13/2017    Urinalysis .n/a  Pertinent Imaging: CLINICAL DATA:  Benign prostatic hypertrophy with urinary retention. Left hydronephrosis.  EXAM: RENAL / URINARY TRACT ULTRASOUND COMPLETE  COMPARISON:  Ultrasound of February 20, 2017. CT scan of January 12, 2017.  FINDINGS: Right Kidney:  Length: 11 cm. Echogenicity within normal limits. No mass or hydronephrosis visualized.  Left Kidney:  Length: 13.2 cm. Echogenicity within normal limits. No mass or hydronephrosis visualized.  Bladder:  Decompressed secondary to Foley catheter.  Incidental note is made of 1 cm rounded echogenic focus in right hepatic lobe most consistent with hemangioma or fatty lesion described on prior CT scan.  IMPRESSION: Normal renal ultrasound. No hydronephrosis or renal obstruction is noted.   Electronically Signed   By: Marijo Conception, M.D.   On: 03/19/2017 13:22  Renal ultrasound personally reviewed today with the patient.  Assessment & Plan:    1. Benign prostatic hyperplasia with urinary retention Status post holmium laser enucleation of the prostate Voiding trial today successful- past  additional necrotic prostate tissue at the time of void trial today, advised there may be additional pieces to pass Continue finasteride and Flomax for the time being until voiding well  2. Hydronephrosis, left Resolved on most recent follow-up renal ultrasound  3. Gross hematuria Resolved, likely related to friable prostatic fossa and UTI In light of recent voiding trial, will continue to box for 3 additional days    Return in about 4 weeks (around 04/17/2017) for PVR, UA, IPSS.  Hollice Espy, MD  [facility] [address] [place], [place] [address] [phone]

## 2017-03-20 NOTE — Progress Notes (Signed)
Fill and Pull Catheter Removal  Patient is present today for a catheter removal.  Patient was cleaned and prepped in a sterile fashion 280ml of sterile water/ saline was instilled into the bladder when the patient felt the urge to urinate. 5ml of water was then drained from the balloon.  A 20FR foley cath was removed from the bladder no complications were noted .  Patient as then given some time to void on their own.  Patient can void  136ml on their own after some time.  Patient tolerated well.  Preformed by: Fonnie Jarvis, CMA  Follow up/ Additional notes: n/a

## 2017-03-21 ENCOUNTER — Ambulatory Visit: Payer: PPO | Admitting: Urology

## 2017-04-13 DIAGNOSIS — R809 Proteinuria, unspecified: Secondary | ICD-10-CM | POA: Diagnosis not present

## 2017-04-13 DIAGNOSIS — E1129 Type 2 diabetes mellitus with other diabetic kidney complication: Secondary | ICD-10-CM | POA: Diagnosis not present

## 2017-04-18 ENCOUNTER — Encounter: Payer: Self-pay | Admitting: Urology

## 2017-04-18 ENCOUNTER — Ambulatory Visit (INDEPENDENT_AMBULATORY_CARE_PROVIDER_SITE_OTHER): Payer: PPO | Admitting: Urology

## 2017-04-18 VITALS — BP 145/72 | HR 87 | Ht 72.0 in | Wt 190.0 lb

## 2017-04-18 DIAGNOSIS — N393 Stress incontinence (female) (male): Secondary | ICD-10-CM

## 2017-04-18 DIAGNOSIS — N400 Enlarged prostate without lower urinary tract symptoms: Secondary | ICD-10-CM

## 2017-04-18 LAB — MICROSCOPIC EXAMINATION
Epithelial Cells (non renal): NONE SEEN /hpf (ref 0–10)
RBC MICROSCOPIC, UA: NONE SEEN /HPF (ref 0–?)

## 2017-04-18 LAB — URINALYSIS, COMPLETE
BILIRUBIN UA: NEGATIVE
KETONES UA: NEGATIVE
NITRITE UA: NEGATIVE
RBC UA: NEGATIVE
SPEC GRAV UA: 1.025 (ref 1.005–1.030)
UUROB: 0.2 mg/dL (ref 0.2–1.0)
pH, UA: 5.5 (ref 5.0–7.5)

## 2017-04-18 LAB — BLADDER SCAN AMB NON-IMAGING

## 2017-04-18 NOTE — Progress Notes (Signed)
04/18/2017 5:21 PM   Lawrence Hart Bench Apr 01, 1946 [id number]  Referring provider: Leonel Ramsay, MD [place], [place] [address]  Chief Complaint  Patient presents with  . 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   His postoperative course has been complicated by urinary retention with clots status post Foley catheter placement on 03/14/2017. Urine culture grew Klebsiella and he was appropriately treated with Bactrim.  His Foley was subsequently removed and is been voiding spontaneously since. He did pass a large piece of tissue at the time of voiding trial.  Immediately postop, he did have some left hydronephrosis which appears to result on most recent renal ultrasound.  Follow-up renal shunt showed resolution of this.  Surgical pathology consistent with benign prostatic glandular and stromal hyperplasia as well as chronic prostatitis and urethritis. Total resection volume 145 g in formalin.  He continues on finasteride and Flomax both started at the time of retention.  Today, he reports that he is voiding very well. He has an excellent stream and feels he is emptying. PVR minimal. No dysuria or gross hematuria. He does complain of incontinence with activity requiring pads/ diaper up to 3/ day when very active and 1-2 when less active.  These are not saturated. This is improving slowly. He is doing pelvic floor exercises daily.  He did undergo a previous negative prostate biopsy proximal to 10 years ago while living in a different state.  Most recent PSA 5.88 on 03/02/16.  Prior to this, it was high as 10. Most recent rectal exam by Dr. Nicki Reaper MacDiarmid unremarkable other than for massive enlargement.        IPSS    Row Name 04/18/17 1000         International Prostate Symptom Score   How often have you had the  sensation of not emptying your bladder? Not at All     How often have you had to urinate less than every two hours? Not at All     How often have you found you stopped and started again several times when you urinated? Not at All     How often have you found it difficult to postpone urination? Not at All     How often have you had a weak urinary stream? Less than 1 in 5 times     How often have you had to strain to start urination? Not at All     How many times did you typically get up at night to urinate? 2 Times     Total IPSS Score 3       Quality of Life due to urinary symptoms   If you were to spend the rest of your life with your urinary condition just the way it is now how would you feel about that? Mostly Satisfied        Score:  1-7 Mild 8-19 Moderate 20-35 Severe   PMH: Past Medical History:  Diagnosis Date  . Diabetes mellitus without complication ([place])   . Foley catheter in place   . Hypertension   . Prostate hypertrophy     Surgical History: Past Surgical History:  Procedure Laterality Date  . COLONOSCOPY    . dental implants    . HOLEP-LASER ENUCLEATION OF THE PROSTATE WITH MORCELLATION N/A 02/12/2017   Procedure: HOLEP-LASER  ENUCLEATION OF THE PROSTATE WITH MORCELLATION;  Surgeon: Hollice Espy, MD;  Location: [facility];  Service: Urology;  Laterality: N/A;  . HOLMIUM LASER APPLICATION N/A 12/15/1243   Procedure: HOLMIUM LASER APPLICATION;  Surgeon: Hollice Espy, MD;  Location: [facility];  Service: Urology;  Laterality: N/A;  . MOUTH SURGERY    . none      Home Medications:  Allergies as of 04/18/2017      Reactions   Other Nausea Only   NIGHT SHADE PLANTS / INDIGESTION Bell peppers      Medication List       Accurate as of 04/18/17  5:21 PM. Always use your most recent med list.          acetaminophen 500 MG tablet Commonly known as:  TYLENOL Take 1,000 mg by mouth every 8 (eight) hours as needed.   atorvastatin 10 MG tablet Commonly known  as:  LIPITOR Take 10 mg by mouth every evening.   CALCIUM 600 + D PO Take 1 tablet by mouth daily with supper.   docusate sodium 100 MG capsule Commonly known as:  COLACE Take 1 capsule (100 mg total) by mouth 2 (two) times daily as needed (take to keep stool soft.).   feeding supplement (GLUCERNA SHAKE) Liqd Take 237 mLs by mouth 2 (two) times daily between meals.   finasteride 5 MG tablet Commonly known as:  PROSCAR Take 1 tablet (5 mg total) by mouth daily.   glimepiride 1 MG tablet Commonly known as:  AMARYL Take 1 tablet by mouth daily.   hydrocortisone cream 1 % Apply 1 application topically as needed for itching.   losartan 25 MG tablet Commonly known as:  COZAAR Take 12.5 mg by mouth daily.   metFORMIN 500 MG tablet Commonly known as:  GLUCOPHAGE Take 500 mg by mouth 2 (two) times daily.   multivitamin with minerals Tabs tablet Take 1 tablet by mouth daily.   vitamin C 500 MG tablet Commonly known as:  ASCORBIC ACID Take 500 mg by mouth daily with supper.       Allergies:  Allergies  Allergen Reactions  . Other Nausea Only    NIGHT SHADE PLANTS / INDIGESTION Bell peppers    Family History: Family History  Problem Relation Age of Onset  . Kidney disease Mother   . Breast cancer Mother   . Heart failure Mother   . Heart disease Father   . Prostate cancer Neg Hx   . Bladder Cancer Neg Hx   . Kidney cancer Neg Hx     Social History:  reports that he has quit smoking. He has never used smokeless tobacco. He reports that he drinks alcohol. He reports that he does not use drugs.  ROS: 12 point review systems is negative other than as per history of present illness.  Physical Exam: BP (!) 145/72   Pulse 87   Ht 6' (1.829 m)   Wt 190 lb (86.2 kg)   BMI 25.77 kg/m   Constitutional:  Alert and oriented, No acute distress.  Accompanied by wife today. HEENT: [place] AT, moist mucus membranes.  Trachea midline, no masses. Cardiovascular: No clubbing,  cyanosis, or edema. Respiratory: Normal respiratory effort, no increased work of breathing. Skin: No rashes, bruises or suspicious lesions. Neurologic: Grossly intact, no focal deficits, moving all 4 extremities. Psychiatric: Normal mood and affect.  Laboratory Data: Lab Results  Component Value Date   WBC 7.1 02/13/2017   HGB 8.1 (L) 02/13/2017   HCT 24.1 (  L) 02/13/2017   MCV 77.7 (L) 02/13/2017   PLT 127 (L) 02/13/2017    Lab Results  Component Value Date   CREATININE 0.83 02/13/2017    Urinalysis Results for orders placed or performed in visit on 04/18/17  Microscopic Examination  Result Value Ref Range   WBC, UA 6-10 (A) 0 - 5 /hpf   RBC, UA None seen 0 - 2 /hpf   Epithelial Cells (non renal) None seen 0 - 10 /hpf   Mucus, UA Present (A) Not Estab.   Bacteria, UA Few (A) None seen/Few  Urinalysis, Complete  Result Value Ref Range   Specific Gravity, UA 1.025 1.005 - 1.030   pH, UA 5.5 5.0 - 7.5   Color, UA Yellow Yellow   Appearance Ur Clear Clear   Leukocytes, UA 1+ (A) Negative   Protein, UA 1+ (A) Negative/Trace   Glucose, UA Trace (A) Negative   Ketones, UA Negative Negative   RBC, UA Negative Negative   Bilirubin, UA Negative Negative   Urobilinogen, Ur 0.2 0.2 - 1.0 mg/dL   Nitrite, UA Negative Negative   Microscopic Examination See below:   BLADDER SCAN AMB NON-IMAGING  Result Value Ref Range   Scan Result 59ml     Pertinent Imaging: N/a   Assessment & Plan:    1. Benign prostatic hyperplasia with history of urinary retention Status post holmium laser enucleation of the prostate Overall excellent response, voiding well with minimal postvoid residual Advised to stop Flomax at this time, will taper off all meds over the next 6 months  2. 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  Hollice Espy, MD  [facility] [address] [place], [place] [address] [phone]

## 2017-04-20 DIAGNOSIS — E7800 Pure hypercholesterolemia, unspecified: Secondary | ICD-10-CM | POA: Diagnosis not present

## 2017-04-20 DIAGNOSIS — E1129 Type 2 diabetes mellitus with other diabetic kidney complication: Secondary | ICD-10-CM | POA: Diagnosis not present

## 2017-04-20 DIAGNOSIS — R809 Proteinuria, unspecified: Secondary | ICD-10-CM | POA: Diagnosis not present

## 2017-05-09 ENCOUNTER — Telehealth: Payer: Self-pay | Admitting: Urology

## 2017-05-09 NOTE — Telephone Encounter (Signed)
[facility] called [facility] that Pt is requesting a 90 day suppy/Refill on his finesteride. Please call and advise @ [phone]. Thanks

## 2017-05-11 ENCOUNTER — Telehealth: Payer: Self-pay | Admitting: Urology

## 2017-05-11 DIAGNOSIS — N401 Enlarged prostate with lower urinary tract symptoms: Secondary | ICD-10-CM

## 2017-05-11 MED ORDER — FINASTERIDE 5 MG PO TABS: 5.0000 mg | ORAL_TABLET | Freq: Every day | ORAL | 0 refills | Status: DC

## 2017-05-11 NOTE — Telephone Encounter (Signed)
Medication sent to pharmacy  

## 2017-05-11 NOTE — Telephone Encounter (Signed)
Patient is calling and asking for a refill on his finasteride said he is going out of town today and wants it done today.   Sharyn Lull

## 2017-07-20 ENCOUNTER — Other Ambulatory Visit: Payer: Self-pay

## 2017-07-20 DIAGNOSIS — N401 Enlarged prostate with lower urinary tract symptoms: Secondary | ICD-10-CM

## 2017-07-20 MED ORDER — FINASTERIDE 5 MG PO TABS: 5.0000 mg | ORAL_TABLET | Freq: Every day | ORAL | 11 refills | Status: DC

## 2017-10-08 ENCOUNTER — Other Ambulatory Visit: Payer: Self-pay

## 2017-10-08 DIAGNOSIS — N401 Enlarged prostate with lower urinary tract symptoms: Secondary | ICD-10-CM

## 2017-10-11 ENCOUNTER — Other Ambulatory Visit: Payer: Medicare HMO

## 2017-10-11 DIAGNOSIS — N401 Enlarged prostate with lower urinary tract symptoms: Secondary | ICD-10-CM

## 2017-10-12 LAB — PSA: Prostate Specific Ag, Serum: 0.1 ng/mL (ref 0.0–4.0)

## 2017-10-17 ENCOUNTER — Ambulatory Visit: Payer: Medicare HMO | Admitting: Urology

## 2017-10-17 ENCOUNTER — Encounter: Payer: Self-pay | Admitting: Urology

## 2017-10-17 VITALS — BP 165/78 | HR 80 | Ht 72.0 in | Wt 200.0 lb

## 2017-10-17 DIAGNOSIS — N400 Enlarged prostate without lower urinary tract symptoms: Secondary | ICD-10-CM

## 2017-10-17 LAB — BLADDER SCAN AMB NON-IMAGING

## 2017-10-17 NOTE — Progress Notes (Signed)
10/17/2017 3:28 PM   Lawrence Hart February 10, 1946 [id number]  Referring provider: Leonel Ramsay, MD [name],  [id number]  Chief Complaint  Patient presents with  . Benign Prostatic Hypertrophy    57month    HPI: 72 year old male with a history of massive BPH (300 cc prostate) and history of urinary retention returns today for 66-month follow-up following HoLEP.  Surgical pathology consistent with benign prostatic glandular and stromal hyperplasia as well as chronic prostatitis and urethritis. Total resection volume 145 g in formalin.  He stopped his  Flomax postop but continues finasteride at this point in time.  Follow-up PSA today 0.1 which is down markedly from previous values ranging from 5.88 to 10.27 over the past 5 years.  He did undergo a previous negative prostate biopsy proximal to 10 years ago while living in a different state.  Overall, he is exceptionally pleased with his urinary symptoms.  He has an excellent stream and feels like he empties for the most part.  IPSS as below.  He is very glad he had the surgery.  He does occasionally have 1 or 2 drops of urine with vigorous exercises including swinging a golf club or with vigorous sneezing.  This is not bothersome to him.  He wears a protective pad for this.  PVR today 51 cc.      IPSS    Row Name 10/17/17 1000         International Prostate Symptom Score   How often have you had the sensation of not emptying your bladder?  Not at All     How often have you had to urinate less than every two hours?  Not at All     How often have you found you stopped and started again several times when you urinated?  Not at All     How often have you found it difficult to postpone urination?  Not at All     How often have you had a weak urinary stream?  Not at All     How often have you had to strain to start urination?  Not at All     How many times did you typically get up at night to  urinate?  1 Time     Total IPSS Score  1       Quality of Life due to urinary symptoms   If you were to spend the rest of your life with your urinary condition just the way it is now how would you feel about that?  Delighted        Score:  1-7 Mild 8-19 Moderate 20-35 Severe    PMH: Past Medical History:  Diagnosis Date  . Diabetes mellitus without complication ([place])   . Foley catheter in place   . Hypertension   . Prostate hypertrophy     Surgical History: Past Surgical History:  Procedure Laterality Date  . COLONOSCOPY    . dental implants    . HOLEP-LASER ENUCLEATION OF THE PROSTATE WITH MORCELLATION N/A 02/12/2017   Procedure: HOLEP-LASER ENUCLEATION OF THE PROSTATE WITH MORCELLATION;  Surgeon: Hollice Espy, MD;  Location: [facility];  Service: Urology;  Laterality: N/A;  . HOLMIUM LASER APPLICATION N/A 02/11/1323   Procedure: HOLMIUM LASER APPLICATION;  Surgeon: Hollice Espy, MD;  Location: [facility];  Service: Urology;  Laterality: N/A;  . MOUTH SURGERY    . none      Home Medications:  Allergies as of 10/17/2017  Reactions   Other Nausea Only   NIGHT SHADE PLANTS / INDIGESTION Bell peppers      Medication List        Accurate as of 10/17/17  3:28 PM. Always use your most recent med list.          acetaminophen 500 MG tablet Commonly known as:  TYLENOL Take 1,000 mg by mouth every 8 (eight) hours as needed.   atorvastatin 10 MG tablet Commonly known as:  LIPITOR Take 10 mg by mouth every evening.   CALCIUM 600 + D PO Take 1 tablet by mouth daily with supper.   feeding supplement (GLUCERNA SHAKE) Liqd Take 237 mLs by mouth 2 (two) times daily between meals.   glimepiride 1 MG tablet Commonly known as:  AMARYL Take 1 tablet by mouth daily.   hydrocortisone cream 1 % Apply 1 application topically as needed for itching.   losartan 25 MG tablet Commonly known as:  COZAAR Take 12.5 mg by mouth daily.   metFORMIN 500 MG tablet Commonly  known as:  GLUCOPHAGE Take 500 mg by mouth 2 (two) times daily.   multivitamin with minerals Tabs tablet Take 1 tablet by mouth daily.   vitamin C 500 MG tablet Commonly known as:  ASCORBIC ACID Take 500 mg by mouth daily with supper.       Allergies:  Allergies  Allergen Reactions  . Other Nausea Only    NIGHT SHADE PLANTS / INDIGESTION Bell peppers    Family History: Family History  Problem Relation Age of Onset  . Kidney disease Mother   . Breast cancer Mother   . Heart failure Mother   . Heart disease Father   . Prostate cancer Neg Hx   . Bladder Cancer Neg Hx   . Kidney cancer Neg Hx     Social History:  reports that he has quit smoking. He has never used smokeless tobacco. He reports that he drinks alcohol. He reports that he does not use drugs.  ROS: UROLOGY Frequent Urination?: No Hard to postpone urination?: No Burning/pain with urination?: No Get up at night to urinate?: No Leakage of urine?: No Urine stream starts and stops?: No Trouble starting stream?: No Do you have to strain to urinate?: No Blood in urine?: No Urinary tract infection?: No Sexually transmitted disease?: No Injury to kidneys or bladder?: No Painful intercourse?: No Weak stream?: No Erection problems?: No Penile pain?: No  Gastrointestinal Nausea?: No Vomiting?: No Indigestion/heartburn?: No Diarrhea?: No Constipation?: No  Constitutional Fever: No Night sweats?: No Weight loss?: No Fatigue?: No  Skin Skin rash/lesions?: No Itching?: No  Eyes Blurred vision?: No Double vision?: No  Ears/Nose/Throat Sore throat?: No Sinus problems?: No  Hematologic/Lymphatic Swollen glands?: No Easy bruising?: No  Cardiovascular Leg swelling?: No Chest pain?: No  Respiratory Cough?: No Shortness of breath?: No  Endocrine Excessive thirst?: No  Musculoskeletal Back pain?: No Joint pain?: No  Neurological Headaches?: No Dizziness?:  No  Psychologic Depression?: No Anxiety?: No  Physical Exam: BP (!) 165/78   Pulse 80   Ht 6' (1.829 m)   Wt 200 lb (90.7 kg)   BMI 27.12 kg/m   Constitutional:  Alert and oriented, No acute distress. HEENT: [place] AT, moist mucus membranes.  Trachea midline, no masses. Cardiovascular: No clubbing, cyanosis, or edema. Respiratory: Normal respiratory effort, no increased work of breathing. Skin: No rashes, bruises or suspicious lesions. Neurologic: Grossly intact, no focal deficits, moving all 4 extremities. Psychiatric: Normal mood and affect.  Laboratory Data: Lab Results  Component Value Date   WBC 7.1 02/13/2017   HGB 8.1 (L) 02/13/2017   HCT 24.1 (L) 02/13/2017   MCV 77.7 (L) 02/13/2017   PLT 127 (L) 02/13/2017    Lab Results  Component Value Date   CREATININE 0.83 02/13/2017   Component     Latest Ref Rng & Units 10/11/2017  Prostate Specific Ag, Serum     0.0 - 4.0 ng/mL 0.1    Urinalysis N/a  Pertinent Imaging: Results for orders placed or performed in visit on 10/17/17  BLADDER SCAN AMB NON-IMAGING  Result Value Ref Range   Scan Result 8ml     Assessment & Plan:    1. Benign prostatic hyperplasia, unspecified whether lower urinary tract symptoms present S/p successful holmium laser enucleation for massive BPH Extremely pleased with result with adequate bladder emptying, resolution of urinary retention This point time, he may stop his finasteride He will follow-up as needed PSA dramatically lower consistent with rigorous resection - BLADDER SCAN AMB NON-IMAGING   Return in about 1 year (around 10/18/2018).  Hollice Espy, MD  [facility] [address], [place], [place] [address] [phone]

## 2018-01-17 ENCOUNTER — Ambulatory Visit: Payer: PPO

## 2018-03-26 ENCOUNTER — Other Ambulatory Visit: Payer: Self-pay

## 2018-03-26 ENCOUNTER — Encounter: Payer: Self-pay | Admitting: *Deleted

## 2018-03-28 NOTE — Discharge Instructions (Signed)

## 2018-04-02 ENCOUNTER — Ambulatory Visit: Payer: Medicare HMO | Admitting: Anesthesiology

## 2018-04-02 ENCOUNTER — Ambulatory Visit
Admission: RE | Admit: 2018-04-02 | Discharge: 2018-04-02 | Disposition: A | Discharge status: Home / Self Care | Payer: Medicare HMO | Source: Ambulatory Visit | Attending: Ophthalmology | Admitting: Ophthalmology

## 2018-04-02 ENCOUNTER — Encounter
Admission: RE | Disposition: A | Discharge status: Home / Self Care | Payer: Self-pay | Source: Ambulatory Visit | Attending: Ophthalmology

## 2018-04-02 DIAGNOSIS — H2511 Age-related nuclear cataract, right eye: Secondary | ICD-10-CM | POA: Insufficient documentation

## 2018-04-02 DIAGNOSIS — Z7984 Long term (current) use of oral hypoglycemic drugs: Secondary | ICD-10-CM | POA: Insufficient documentation

## 2018-04-02 DIAGNOSIS — Z7982 Long term (current) use of aspirin: Secondary | ICD-10-CM | POA: Insufficient documentation

## 2018-04-02 DIAGNOSIS — E119 Type 2 diabetes mellitus without complications: Secondary | ICD-10-CM | POA: Diagnosis not present

## 2018-04-02 DIAGNOSIS — Z87891 Personal history of nicotine dependence: Secondary | ICD-10-CM | POA: Diagnosis not present

## 2018-04-02 HISTORY — PX: CATARACT EXTRACTION W/PHACO: SHX586

## 2018-04-02 HISTORY — DX: Presence of dental prosthetic device (complete) (partial): Z97.2

## 2018-04-02 HISTORY — DX: Unspecified osteoarthritis, unspecified site: M19.90

## 2018-04-02 LAB — GLUCOSE, CAPILLARY
GLUCOSE-CAPILLARY: 136 mg/dL — AB (ref 70–99)
GLUCOSE-CAPILLARY: 141 mg/dL — AB (ref 70–99)

## 2018-04-02 SURGERY — PHACOEMULSIFICATION, CATARACT, WITH IOL INSERTION
Anesthesia: Monitor Anesthesia Care | Site: Eye | Laterality: Right

## 2018-04-02 MED ORDER — MIDAZOLAM HCL 2 MG/2ML IJ SOLN
INTRAMUSCULAR | Status: DC | PRN
  Administered 2018-04-02 (×2): 1 mg via INTRAVENOUS

## 2018-04-02 MED ORDER — MOXIFLOXACIN HCL 0.5 % OP SOLN
1.0000 [drp] | OPHTHALMIC | Status: DC | PRN
  Administered 2018-04-02 (×3): 1 [drp] via OPHTHALMIC

## 2018-04-02 MED ORDER — BRIMONIDINE TARTRATE-TIMOLOL 0.2-0.5 % OP SOLN
OPHTHALMIC | Status: DC | PRN
  Administered 2018-04-02: 1 [drp] via OPHTHALMIC

## 2018-04-02 MED ORDER — ARMC OPHTHALMIC DILATING DROPS
1.0000 "application " | OPHTHALMIC | Status: DC | PRN
  Administered 2018-04-02 (×3): 1 via OPHTHALMIC

## 2018-04-02 MED ORDER — NA HYALUR & NA CHOND-NA HYALUR 0.4-0.35 ML IO KIT
PACK | INTRAOCULAR | Status: DC | PRN
  Administered 2018-04-02: 1 mL via INTRAOCULAR

## 2018-04-02 MED ORDER — ONDANSETRON HCL 4 MG/2ML IJ SOLN: 4.0000 mg | Freq: Once | INTRAMUSCULAR | Status: DC | PRN

## 2018-04-02 MED ORDER — LACTATED RINGERS IV SOLN: 10.0000 mL/h | INTRAVENOUS | Status: DC

## 2018-04-02 MED ORDER — TETRACAINE HCL 0.5 % OP SOLN
1.0000 [drp] | OPHTHALMIC | Status: DC | PRN
  Administered 2018-04-02 (×2): 1 [drp] via OPHTHALMIC

## 2018-04-02 MED ORDER — FENTANYL CITRATE (PF) 100 MCG/2ML IJ SOLN
INTRAMUSCULAR | Status: DC | PRN
  Administered 2018-04-02: 50 ug via INTRAVENOUS

## 2018-04-02 MED ORDER — EPINEPHRINE PF 1 MG/ML IJ SOLN
INTRAOCULAR | Status: DC | PRN
  Administered 2018-04-02: 84 mL via OPHTHALMIC

## 2018-04-02 MED ORDER — CEFUROXIME OPHTHALMIC INJECTION 1 MG/0.1 ML
INJECTION | OPHTHALMIC | Status: DC | PRN
  Administered 2018-04-02: 0.1 mL via INTRACAMERAL

## 2018-04-02 MED ORDER — LIDOCAINE HCL (PF) 2 % IJ SOLN
INTRAOCULAR | Status: DC | PRN
  Administered 2018-04-02: 1 mL

## 2018-04-02 SURGICAL SUPPLY — 21 items
CANNULA ANT/CHMB 27G (MISCELLANEOUS) ×1 IMPLANT
CANNULA ANT/CHMB 27GA (MISCELLANEOUS) ×3 IMPLANT
GLOVE SURG LX 7.5 STRW (GLOVE) ×2
GLOVE SURG LX STRL 7.5 STRW (GLOVE) ×1 IMPLANT
GLOVE SURG TRIUMPH 8.0 PF LTX (GLOVE) ×3 IMPLANT
GOWN STRL REUS W/ TWL LRG LVL3 (GOWN DISPOSABLE) ×2 IMPLANT
GOWN STRL REUS W/TWL LRG LVL3 (GOWN DISPOSABLE) ×4
LENS IOL ACRSF IQ ULTRA 17.0 (Intraocular Lens) IMPLANT
LENS IOL ACRYSOF IQ 17.0 (Intraocular Lens) ×3 IMPLANT
MARKER SKIN DUAL TIP RULER LAB (MISCELLANEOUS) ×3 IMPLANT
NDL FILTER BLUNT 18X1 1/2 (NEEDLE) ×1 IMPLANT
NEEDLE FILTER BLUNT 18X 1/2SAF (NEEDLE) ×2
NEEDLE FILTER BLUNT 18X1 1/2 (NEEDLE) ×1 IMPLANT
PACK CATARACT BRASINGTON (MISCELLANEOUS) ×3 IMPLANT
PACK EYE AFTER SURG (MISCELLANEOUS) ×3 IMPLANT
PACK OPTHALMIC (MISCELLANEOUS) ×3 IMPLANT
SYR 3ML LL SCALE MARK (SYRINGE) ×3 IMPLANT
SYR 5ML LL (SYRINGE) ×3 IMPLANT
SYR TB 1ML LUER SLIP (SYRINGE) ×3 IMPLANT
WATER STERILE IRR 500ML POUR (IV SOLUTION) ×3 IMPLANT
WIPE NON LINTING 3.25X3.25 (MISCELLANEOUS) ×3 IMPLANT

## 2018-04-02 NOTE — Transfer of Care (Signed)
Immediate Anesthesia Transfer of Care Note  Patient: Lawrence Hart  Procedure(s) Performed: CATARACT EXTRACTION PHACO AND INTRAOCULAR LENS PLACEMENT (IOC) RIGHT DIABETES (Right Eye)  Patient Location: PACU  Anesthesia Type: MAC  Level of Consciousness: awake, alert  and patient cooperative  Airway and Oxygen Therapy: Patient Spontanous Breathing and Patient connected to supplemental oxygen  Post-op Assessment: Post-op Vital signs reviewed, Patient's Cardiovascular Status Stable, Respiratory Function Stable, Patent Airway and No signs of Nausea or vomiting  Post-op Vital Signs: Reviewed and stable  Complications: No apparent anesthesia complications

## 2018-04-02 NOTE — Anesthesia Postprocedure Evaluation (Signed)
Anesthesia Post Note  Patient: Lawrence Hart  Procedure(s) Performed: CATARACT EXTRACTION PHACO AND INTRAOCULAR LENS PLACEMENT (IOC) RIGHT DIABETES (Right Eye)  Patient location during evaluation: PACU Anesthesia Type: MAC Level of consciousness: awake and alert Pain management: pain level controlled Vital Signs Assessment: post-procedure vital signs reviewed and stable Respiratory status: spontaneous breathing, nonlabored ventilation, respiratory function stable and patient connected to nasal cannula oxygen Cardiovascular status: stable and blood pressure returned to baseline Postop Assessment: no apparent nausea or vomiting Anesthetic complications: no    Veda Canning

## 2018-04-02 NOTE — Anesthesia Procedure Notes (Signed)
Procedure Name: [place] Performed by: Cameron Ali, CRNA Pre-anesthesia Checklist: Patient identified, Emergency Drugs available, Suction available, Timeout performed and Patient being monitored Patient Re-evaluated:Patient Re-evaluated prior to induction Oxygen Delivery Method: Nasal cannula Placement Confirmation: positive ETCO2

## 2018-04-02 NOTE — Anesthesia Preprocedure Evaluation (Addendum)
Anesthesia Evaluation  Patient identified by MRN, date of birth, ID band Patient awake    Reviewed: Allergy & Precautions, NPO status , Patient's Chart, lab work & pertinent test results  Airway Mallampati: II  TM Distance: >3 FB     Dental   Pulmonary former smoker,    breath sounds clear to auscultation       Cardiovascular hypertension,  Rhythm:Regular Rate:Normal  HLD   Neuro/Psych    GI/Hepatic   Endo/Other  diabetes  Renal/GU      Musculoskeletal  (+) Arthritis ,   Abdominal   Peds  Hematology   Anesthesia Other Findings   Reproductive/Obstetrics                             Anesthesia Physical Anesthesia Plan  ASA: III  Anesthesia Plan: MAC   Post-op Pain Management:    Induction:   PONV Risk Score and Plan:   Airway Management Planned: Nasal Cannula and Natural Airway  Additional Equipment:   Intra-op Plan:   Post-operative Plan:   Informed Consent: I have reviewed the patients History and Physical, chart, labs and discussed the procedure including the risks, benefits and alternatives for the proposed anesthesia with the patient or authorized representative who has indicated his/her understanding and acceptance.     Plan Discussed with: CRNA  Anesthesia Plan Comments:         Anesthesia Quick Evaluation

## 2018-04-02 NOTE — Op Note (Signed)
LOCATION:  [facility]   PREOPERATIVE DIAGNOSIS:    Nuclear sclerotic cataract right eye. H25.11   POSTOPERATIVE DIAGNOSIS:  Nuclear sclerotic cataract right eye.     PROCEDURE:  Phacoemusification with posterior chamber intraocular lens placement of the right eye   LENS:   Implant Name Type Inv. Item Serial No. Manufacturer Lot No. LRB No. Used  LENS IOL ACRYSOF IQ 17.0 - [id number] Intraocular Lens LENS IOL ACRYSOF IQ 17.0 [id number] ALCON  Right 1        ULTRASOUND TIME: 17 % of 1 minutes, 21 seconds.  CDE 13.9   SURGEON:  Wyonia Hough, MD   ANESTHESIA:  Topical with tetracaine drops and 2% Xylocaine jelly, augmented with 1% preservative-free intracameral lidocaine.    COMPLICATIONS:  None.   DESCRIPTION OF PROCEDURE:  The patient was identified in the holding room and transported to the operating room and placed in the supine position under the operating microscope.  The right eye was identified as the operative eye and it was prepped and draped in the usual sterile ophthalmic fashion.   A 1 millimeter clear-corneal paracentesis was made at the 12:00 position.  0.5 ml of preservative-free 1% lidocaine was injected into the anterior chamber. The anterior chamber was filled with Viscoat viscoelastic.  A 2.4 millimeter keratome was used to make a near-clear corneal incision at the 9:00 position.  A curvilinear capsulorrhexis was made with a cystotome and capsulorrhexis forceps.  Balanced salt solution was used to hydrodissect and hydrodelineate the nucleus.   Phacoemulsification was then used in stop and chop fashion to remove the lens nucleus and epinucleus.  The remaining cortex was then removed using the irrigation and aspiration handpiece. Provisc was then placed into the capsular bag to distend it for lens placement.  A lens was then injected into the capsular bag.  The remaining viscoelastic was aspirated.   Wounds were hydrated with balanced salt solution.   The anterior chamber was inflated to a physiologic pressure with balanced salt solution.  No wound leaks were noted. Cefuroxime 0.1 ml of a 10mg /ml solution was injected into the anterior chamber for a dose of 1 mg of intracameral antibiotic at the completion of the case.   Timolol and Brimonidine drops were applied to the eye.  The patient was taken to the recovery room in stable condition without complications of anesthesia or surgery.   Lawrence Hart 04/02/2018, 8:30 AM

## 2018-04-02 NOTE — H&P (Signed)
The History and Physical notes are on paper, have been signed, and are to be scanned. The patient remains stable and unchanged from the H&P.   Previous H&P reviewed, patient examined, and there are no changes.  Lawrence Hart 04/02/2018 7:35 AM

## 2018-04-03 ENCOUNTER — Encounter: Payer: Self-pay | Admitting: Ophthalmology

## 2018-10-22 ENCOUNTER — Ambulatory Visit: Payer: Medicare HMO | Admitting: Urology

## 2019-01-21 ENCOUNTER — Ambulatory Visit: Payer: Medicare HMO | Admitting: Urology

## 2019-01-21 ENCOUNTER — Encounter: Payer: Self-pay | Admitting: Urology

## 2019-01-21 ENCOUNTER — Other Ambulatory Visit: Payer: Self-pay

## 2019-01-21 VITALS — BP 157/71 | HR 63 | Ht 72.0 in | Wt 202.6 lb

## 2019-01-21 DIAGNOSIS — N401 Enlarged prostate with lower urinary tract symptoms: Secondary | ICD-10-CM

## 2019-01-21 NOTE — Progress Notes (Signed)
01/21/2019 4:54 PM   [place] 1946/05/16 [id number]  Referring provider: Leonel Ramsay, MD [place],  [place] [address]  Chief Complaint  Patient presents with  . Benign Prostatic Hypertrophy    HPI: Lawrence Hart is a 73 y.o. male seen today for annual follow-up of BPH.  Patient has a past medical history of prostatomegaly (300 cc); he underwent HOLEP with Dr. Erlene Quan on 02/12/2017.  Patient is no longer taking finasteride or tamsulosin and has no urinary complaints at this time.  He describes minimal to no urinary incontinence, usually associated with physical exertion.  Most recent PSA on 10/11/2017 was 0.1.  Most recent prostate biopsy was approximately 10 years ago and negative for prostate cancer.  Overall, he is extremely pleased with his result.  He is emptying well.   PMH: Past Medical History:  Diagnosis Date  . Arthritis    hands, knees  . Diabetes mellitus without complication ([place])   . Hypertension   . Prostate hypertrophy   . Wears dentures    full upper and lower. anchored on implants    Surgical History: Past Surgical History:  Procedure Laterality Date  . CATARACT EXTRACTION W/PHACO Right 04/02/2018   Procedure: CATARACT EXTRACTION PHACO AND INTRAOCULAR LENS PLACEMENT ([place]) RIGHT DIABETES;  Surgeon: Leandrew Koyanagi, MD;  Location: [facility];  Service: Ophthalmology;  Laterality: Right;  Diabetic - oral meds  . COLONOSCOPY    . dental implants    . HOLEP-LASER ENUCLEATION OF THE PROSTATE WITH MORCELLATION N/A 02/12/2017   Procedure: HOLEP-LASER ENUCLEATION OF THE PROSTATE WITH MORCELLATION;  Surgeon: Hollice Espy, MD;  Location: [facility];  Service: Urology;  Laterality: N/A;  . HOLMIUM LASER APPLICATION N/A 08/18/9369   Procedure: HOLMIUM LASER APPLICATION;  Surgeon: Hollice Espy, MD;  Location: [facility];  Service: Urology;  Laterality: N/A;  . MOUTH SURGERY      Home Medications:   Allergies as of 01/21/2019      Reactions   Other Nausea Only   NIGHT SHADE PLANTS / INDIGESTION Bell peppers      Medication List       Accurate as of January 21, 2019  4:54 PM. If you have any questions, ask your nurse or doctor.        acetaminophen 500 MG tablet Commonly known as: TYLENOL Take 1,000 mg by mouth every 8 (eight) hours as needed.   atorvastatin 10 MG tablet Commonly known as: LIPITOR Take 10 mg by mouth every evening.   CALCIUM 600 + D PO Take 1 tablet by mouth daily with supper.   glimepiride 1 MG tablet Commonly known as: AMARYL Take 1 tablet by mouth daily.   hydrocortisone cream 1 % Apply 1 application topically as needed for itching.   latanoprost 0.005 % ophthalmic solution Commonly known as: XALATAN INSTILL 1 DROP INTO LEFT EYE AT BEDTIME   losartan 25 MG tablet Commonly known as: COZAAR Take 12.5 mg by mouth daily.   metFORMIN 500 MG tablet Commonly known as: GLUCOPHAGE Take 500 mg by mouth 2 (two) times daily.   multivitamin with minerals Tabs tablet Take 1 tablet by mouth daily.   vitamin C 500 MG tablet Commonly known as: ASCORBIC ACID Take 500 mg by mouth daily with supper.       Allergies:  Allergies  Allergen Reactions  . Other Nausea Only    NIGHT SHADE PLANTS / INDIGESTION Bell peppers    Family History: Family History  Problem Relation Age of Onset  . Kidney disease Mother   . Breast cancer Mother   . Heart failure Mother   . Heart disease Father   . Prostate cancer Neg Hx   . Bladder Cancer Neg Hx   . Kidney cancer Neg Hx     Social History:  reports that he quit smoking about 30 years ago. He has never used smokeless tobacco. He reports current alcohol use of about 2.0 standard drinks of alcohol per week. He reports that he does not use drugs.  ROS: UROLOGY Frequent Urination?: No Hard to postpone urination?: No Burning/pain with urination?: No Get up at night to urinate?: No Leakage of urine?: No  Urine stream starts and stops?: No Trouble starting stream?: No Do you have to strain to urinate?: No Blood in urine?: No Urinary tract infection?: No Sexually transmitted disease?: No Injury to kidneys or bladder?: No Painful intercourse?: No Weak stream?: No Erection problems?: No Penile pain?: No  Gastrointestinal Nausea?: No Vomiting?: No Indigestion/heartburn?: No Diarrhea?: No Constipation?: No  Constitutional Fever: No Night sweats?: No Weight loss?: No Fatigue?: No  Skin Skin rash/lesions?: No Itching?: No  Eyes Blurred vision?: No Double vision?: No  Ears/Nose/Throat Sore throat?: No Sinus problems?: No  Hematologic/Lymphatic Swollen glands?: No Easy bruising?: No  Cardiovascular Leg swelling?: No Chest pain?: No  Respiratory Cough?: No Shortness of breath?: No  Endocrine Excessive thirst?: No  Musculoskeletal Back pain?: No Joint pain?: No  Neurological Headaches?: No Dizziness?: No  Psychologic Depression?: No Anxiety?: No  Physical Exam: BP (!) 157/71 (BP Location: Left Arm, Patient Position: Sitting, Cuff Size: Normal)   Pulse 63   Ht 6' (1.829 m)   Wt 202 lb 9.6 oz (91.9 kg)   BMI 27.48 kg/m   Constitutional:  Alert and oriented, No acute distress. HEENT: [place] AT, moist mucus membranes.  Trachea midline, no masses. Cardiovascular: No clubbing, cyanosis, or edema. Respiratory: Normal respiratory effort, no increased work of breathing. Skin: No rashes, bruises or suspicious lesions. Neurologic: Grossly intact, no focal deficits, moving all 4 extremities. Psychiatric: Normal mood and affect.  Laboratory Data: PSA 10/11/2017: 0.1  Assessment & Plan:   1.  BPH status post HOLEP Patient tolerated the procedure very well and reports minimal to no urinary symptoms at this time.  Counseled the patient that while we were able to greatly reduce the size of his prostate, he still does retain some prostatic tissue, and that there  remains a remote possibility of prostate cancer in the future.  Reiterated that his PSA at 0.1 is extremely low and not concerning for prostate cancer at this time.  Counseled patient that, given his age, further PSA monitoring is not warranted at this time.  Additionally, given his lack of urological complaints, I am comfortable releasing him from my care at this time.  Counseled the patient to return to clinic if he develops new urinary symptoms. -Released from care, no follow-up needed at this time  Hollice Espy, [place] [address] [place], [place] [address] [phone]

## 2019-04-02 IMAGING — CT CT RENAL STONE PROTOCOL
2 of 4 series · 15 of 46 positions shown, 17 images · non-contrast
Comparison: Renal ultrasound performed 03/29/2015

CLINICAL DATA: Acute onset of hematuria.  Initial encounter.

EXAM:
CT ABDOMEN AND PELVIS WITHOUT CONTRAST
TECHNIQUE: Multidetector CT imaging of the abdomen and pelvis was performed
following the standard protocol without IV contrast.

[Series 2: stone full standard · axial · 0.81mm/px · z∈[-1130,-695]mm · 12 of 99 slices shown, 14 images]
[im 8/99  soft-tissue]
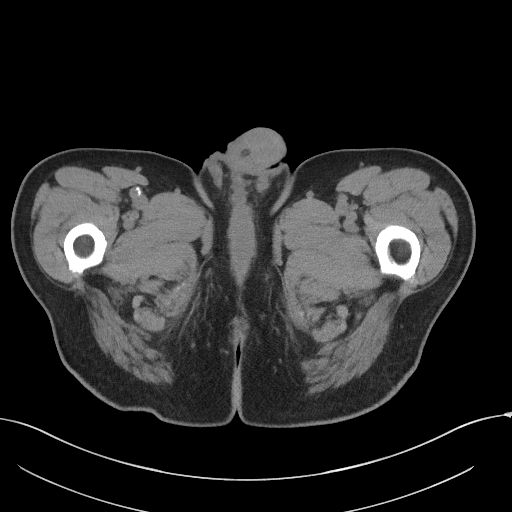
[im 8/99  bone]
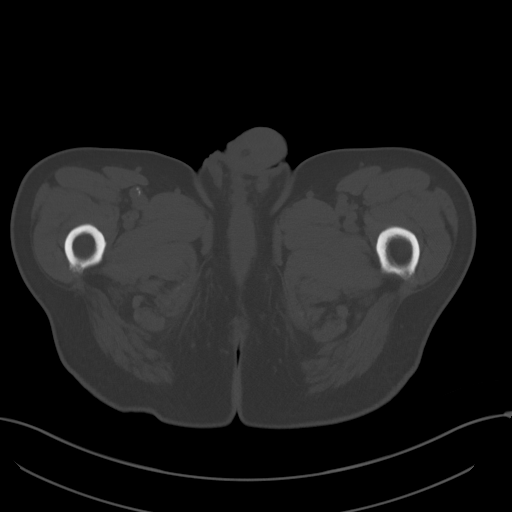
[im 16/99  soft-tissue]
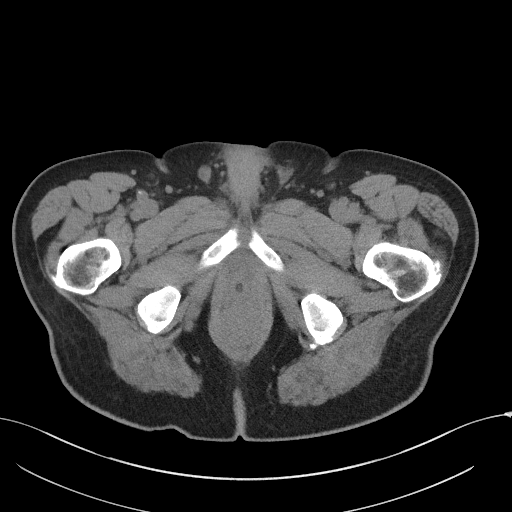
[im 24/99  soft-tissue]
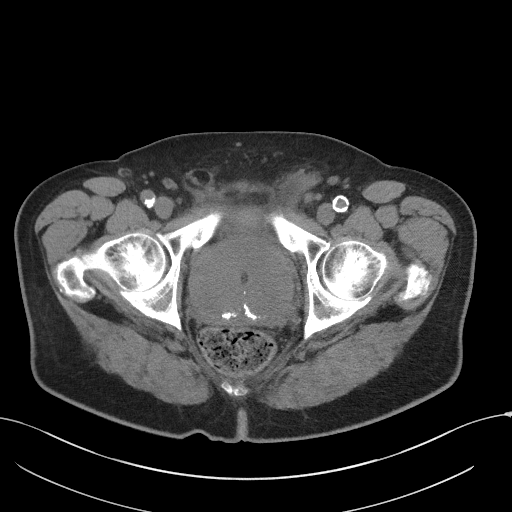
[im 32/99  soft-tissue]
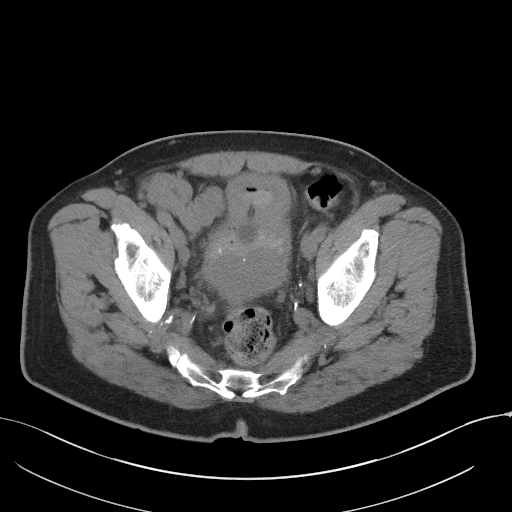
[im 40/99  soft-tissue]
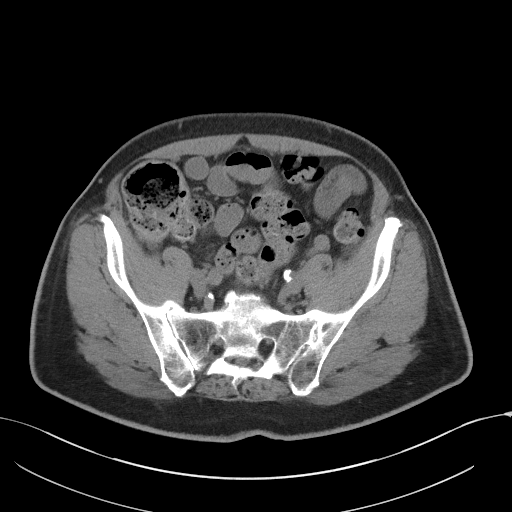
[im 48/99  soft-tissue]
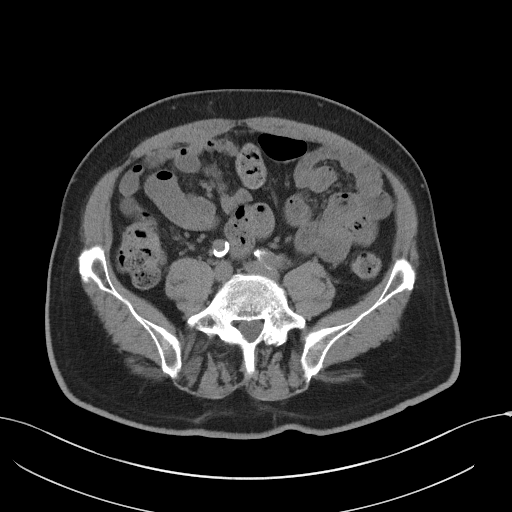
[im 55/99  soft-tissue]
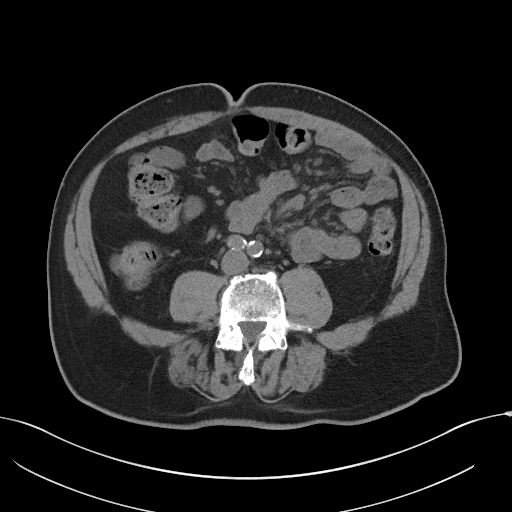
[im 63/99  soft-tissue]
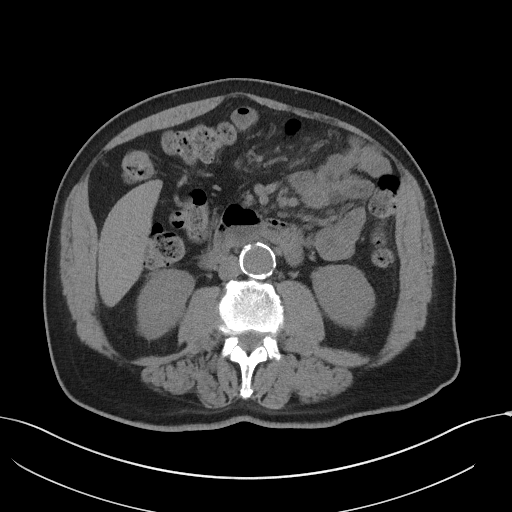
[im 71/99  soft-tissue]
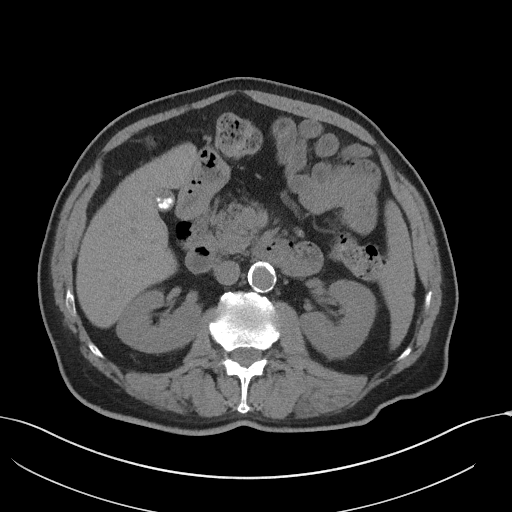
[im 71/99  bone]
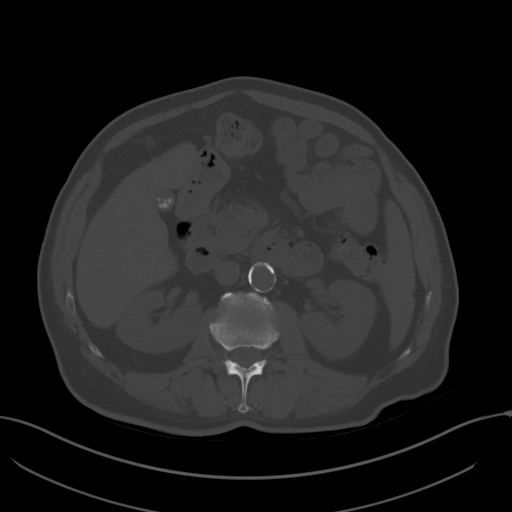
[im 79/99  soft-tissue]
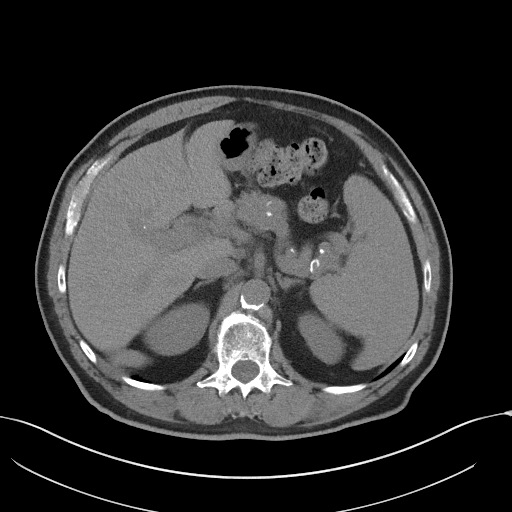
[im 87/99  soft-tissue]
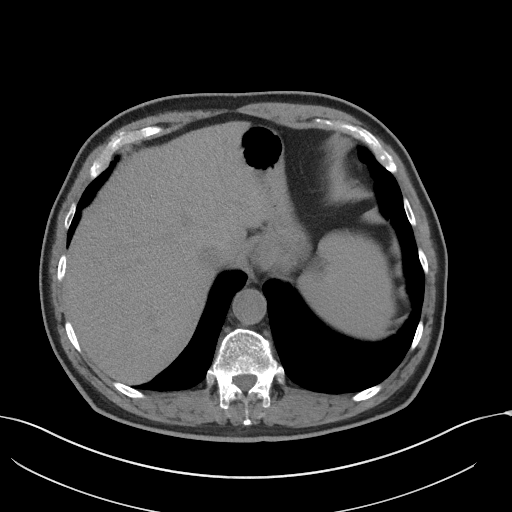
[im 95/99  soft-tissue]
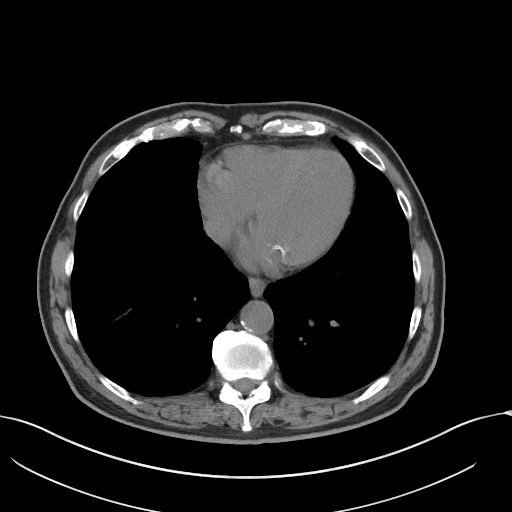

[Series 5: coronal · coronal · 0.83mm/px · 3 of 145 slices shown]
[im 49/145  soft-tissue]
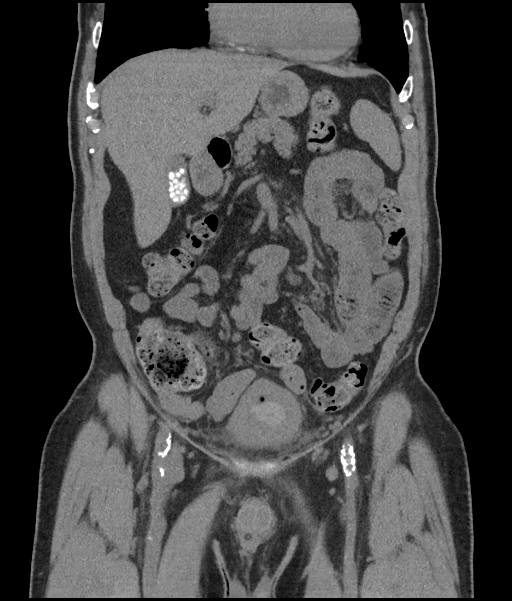
[im 65/145  soft-tissue]
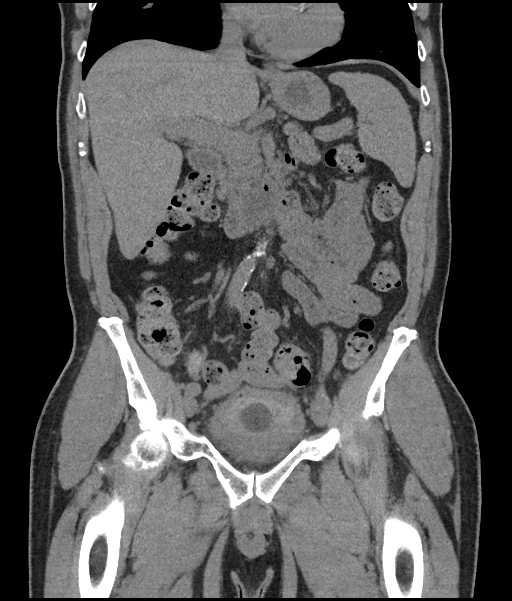
[im 81/145  soft-tissue]
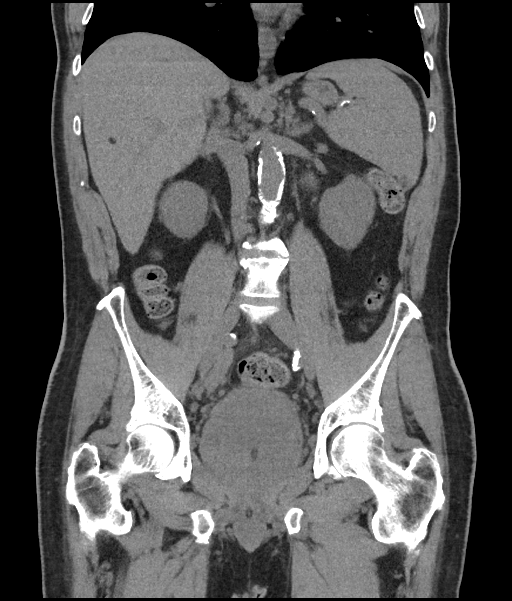

[15 of 46 positions shown; findings below may reference images not displayed]

FINDINGS: Lower chest: Calcification is noted at the mitral valve. The
visualized lung bases are clear.

Hepatobiliary: The liver is unremarkable in appearance, aside from a
small focus of fat at the right hepatic lobe. Stones are noted
within the gallbladder. The gallbladder is contracted and otherwise
unremarkable. The common bile duct remains normal in caliber.

Pancreas: The pancreas is within normal limits.

Spleen: The spleen is unremarkable in appearance.

Adrenals/Urinary Tract: The adrenal glands are unremarkable in
appearance.

Nonspecific perinephric stranding is noted bilaterally. There is no
evidence of hydronephrosis. No renal or ureteral stones are
identified.

Stomach/Bowel: The stomach is unremarkable in appearance. The small
bowel is within normal limits. The appendix is not visualized; there
is no evidence for appendicitis. The colon is unremarkable in
appearance.

Vascular/Lymphatic: Diffuse calcification is seen along the
abdominal aorta and its branches. There is ectasia of the distal
abdominal aorta to 2.8 cm in AP dimension. The inferior vena cava is
grossly unremarkable. No retroperitoneal lymphadenopathy is seen. No
pelvic sidewall lymphadenopathy is identified.

Reproductive: The bladder contains blood, with diffuse bladder wall
thickening. Underlying mass cannot be excluded. A Foley catheter is
noted in expected position. There is marked enlargement of the
prostate, measuring 8.5 cm in transverse dimension.

Other: No additional soft tissue abnormalities are seen.

Musculoskeletal: No acute osseous abnormalities are identified. The
visualized musculature is unremarkable in appearance.
IMPRESSION: 1. Bladder contains blood, with diffuse bladder wall thickening.
Underlying mass cannot be excluded. Foley catheter noted in expected
position.
2. Marked enlargement of the prostate, measuring 8.5 cm in
transverse dimension. Would correlate with PSA.
3. Diffuse aortic atherosclerosis.
4. Ectatic abdominal aorta at risk for aneurysm development.
Recommend followup by ultrasound in 5 years. This recommendation
follows ACR consensus guidelines: White Paper of the ACR Incidental
Findings Committee II on Vascular Findings. [HOSPITAL] 2835;
[DATE].
5. Calcification at the mitral valve.
6. Cholelithiasis. Gallbladder contracted and otherwise
unremarkable.

## 2019-07-09 IMAGING — US US RENAL
1 series · 14 of 25 positions shown · non-contrast
Comparison: Ultrasound February 20, 2017. CT scan January 12, 2017.

CLINICAL DATA: Benign prostatic hypertrophy with urinary retention.
Left hydronephrosis.

EXAM:
RENAL / URINARY TRACT ULTRASOUND COMPLETE

[Series 1: us renal · 0.28mm/px · 14 of 35 slices shown]
[im 1/35]
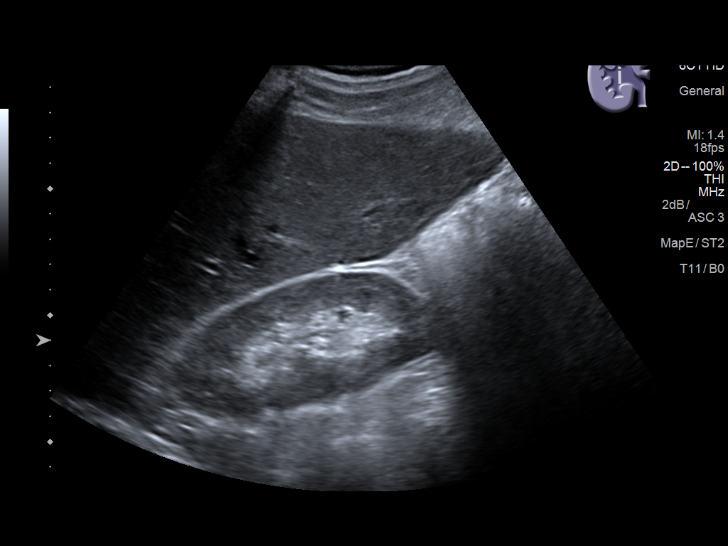
[im 3/35]
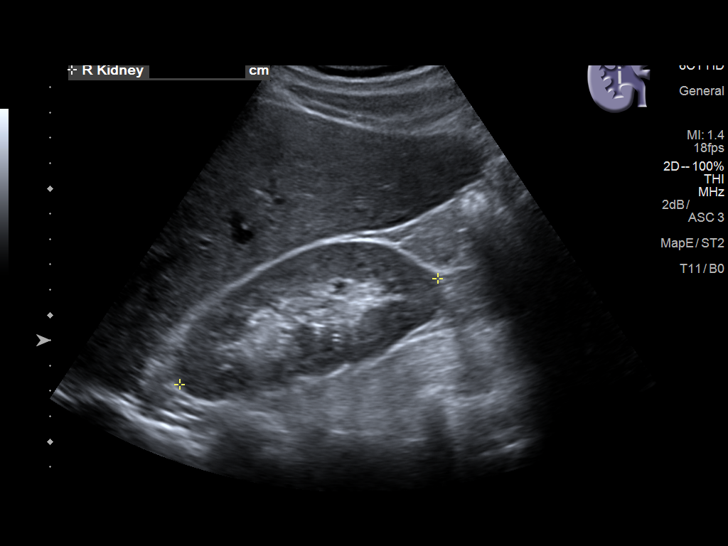
[im 6/35]
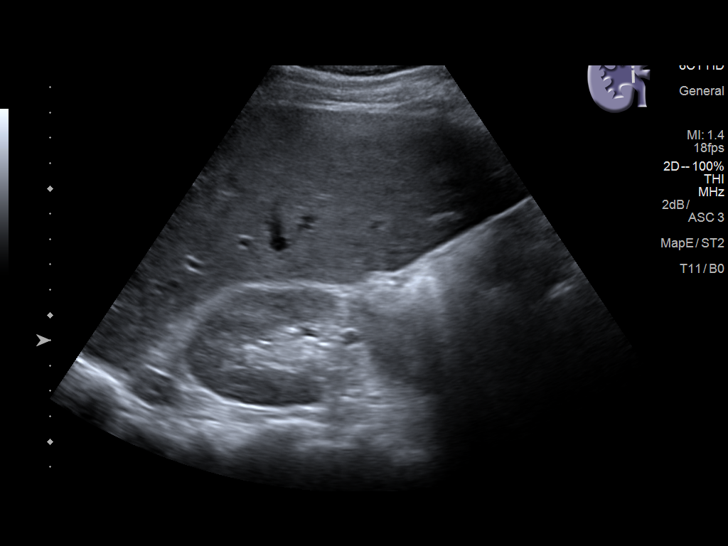
[im 9/35]
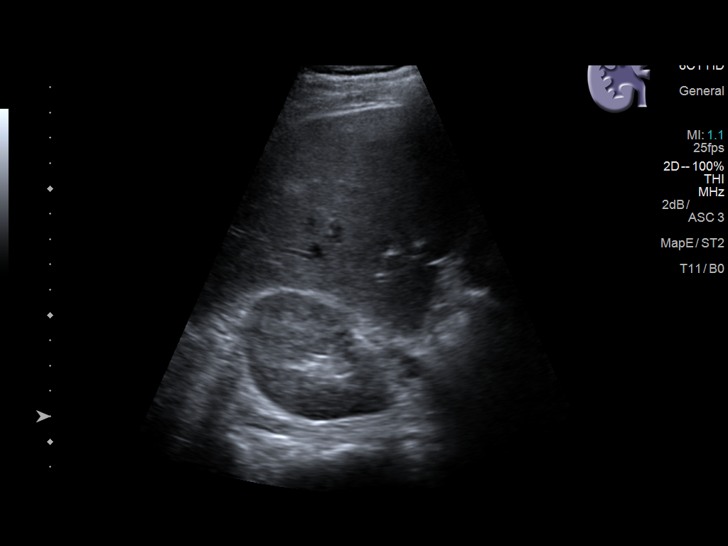
[im 12/35]
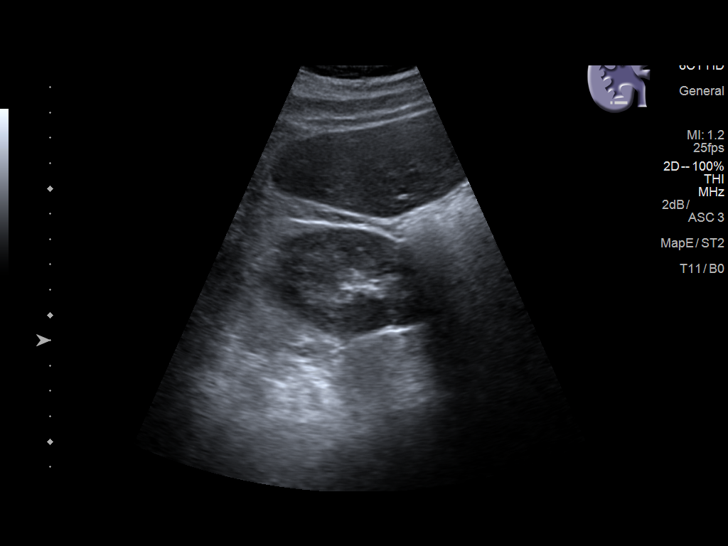
[im 13/35]
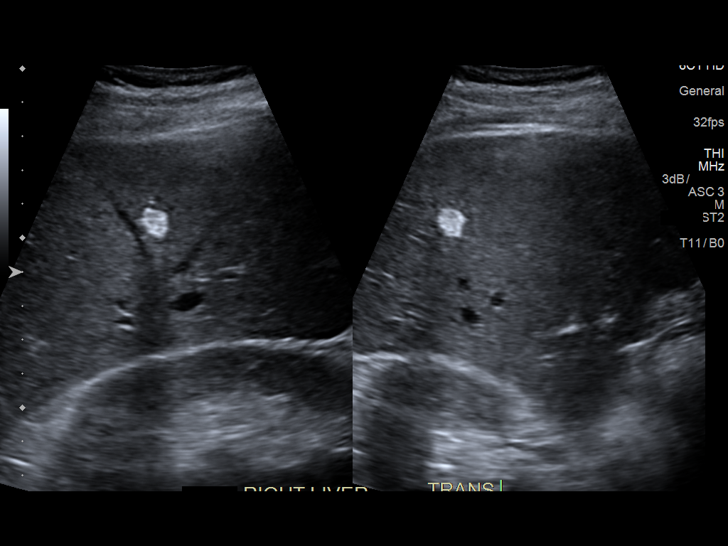
[im 16/35]
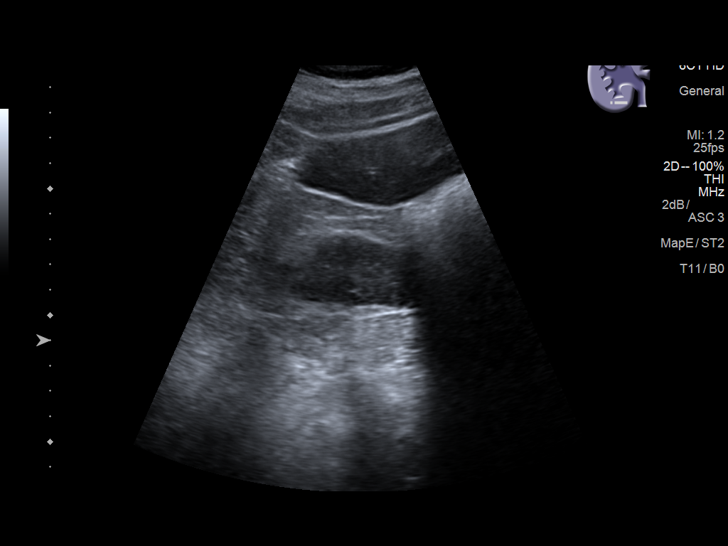
[im 19/35]
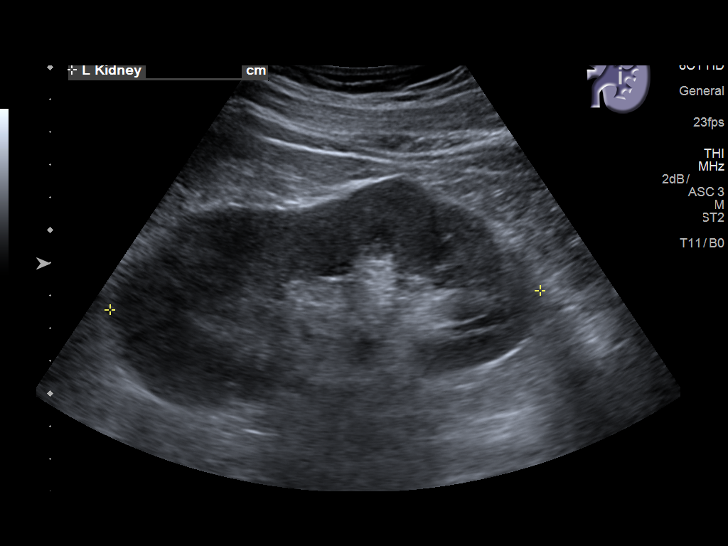
[im 22/35]
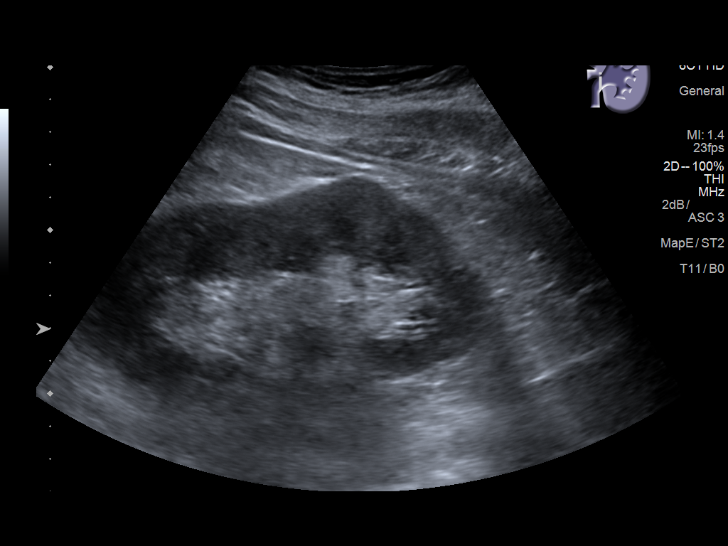
[im 23/35]
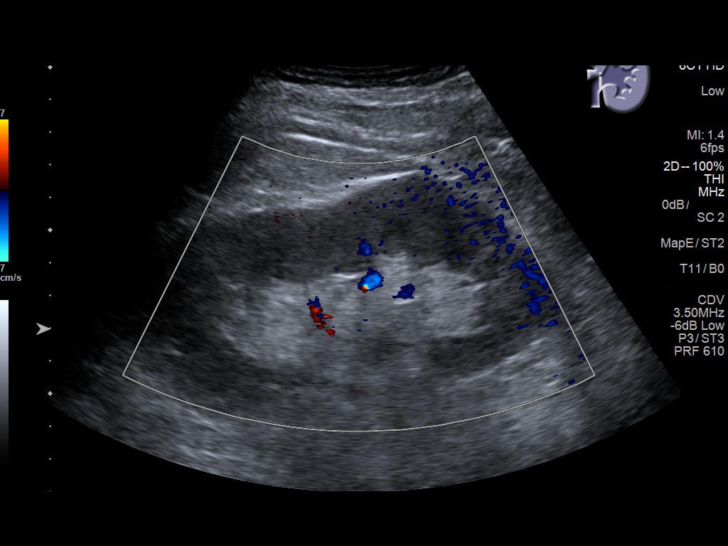
[im 26/35]
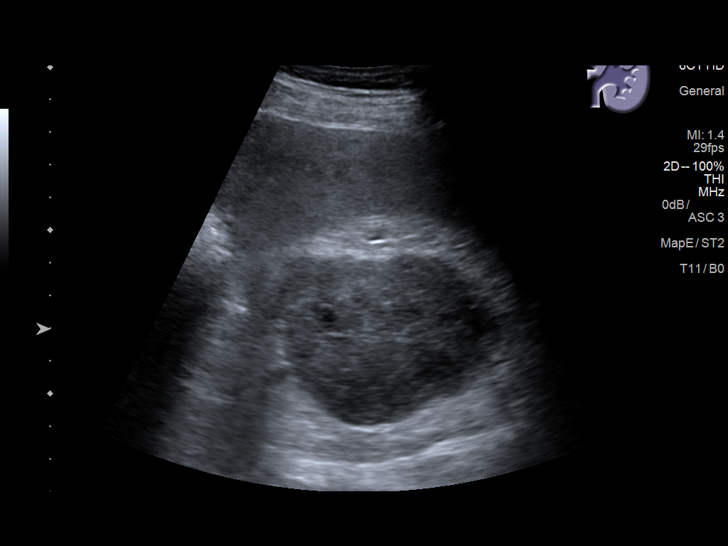
[im 29/35]
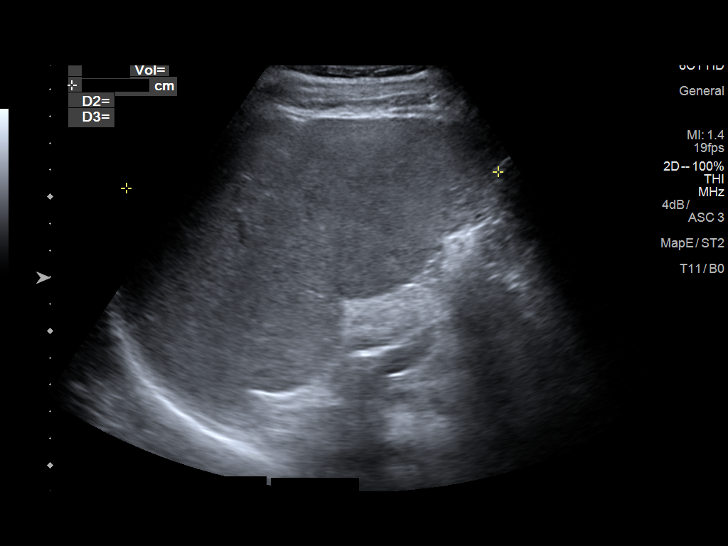
[im 32/35]
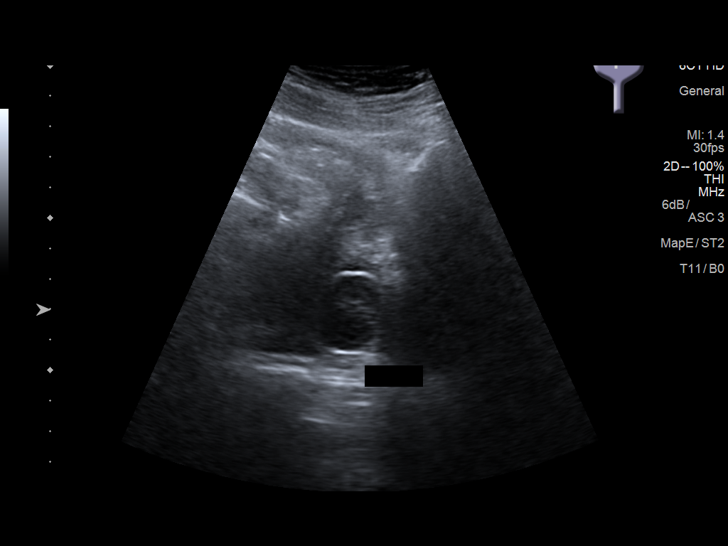
[im 35/35]
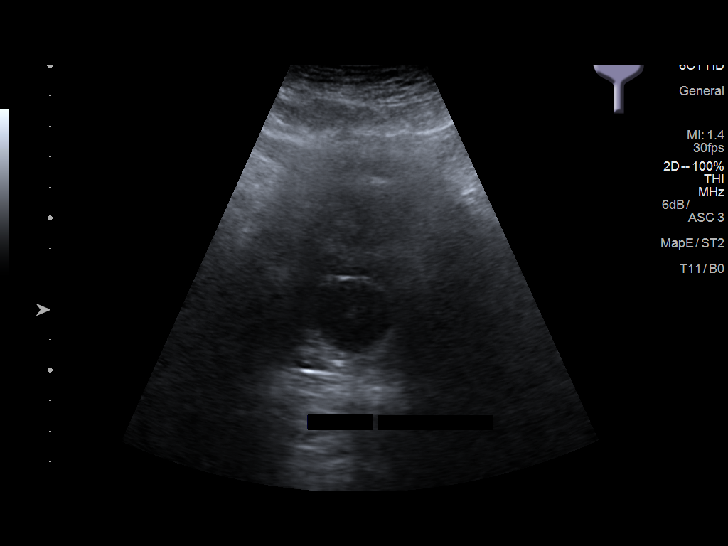

[14 of 25 positions shown; findings below may reference images not displayed]

FINDINGS: Right Kidney:

Length: 11 cm. Echogenicity within normal limits. No mass or
hydronephrosis visualized.

Left Kidney:

Length: 13.2 cm. Echogenicity within normal limits. No mass or
hydronephrosis visualized.

Bladder:

Decompressed secondary to Foley catheter.

Incidental note is made of 1 cm rounded echogenic focus in right
hepatic lobe most consistent with hemangioma or fatty lesion
described on prior CT scan.
IMPRESSION: Normal renal ultrasound. No hydronephrosis or renal obstruction is
noted.

## 2019-08-11 ENCOUNTER — Other Ambulatory Visit: Payer: Self-pay

## 2019-08-11 ENCOUNTER — Emergency Department
Admission: EM | Admit: 2019-08-11 | Discharge: 2019-08-12 | Disposition: A | Discharge status: Home / Self Care | Payer: Medicare HMO | Attending: Emergency Medicine | Admitting: Emergency Medicine

## 2019-08-11 ENCOUNTER — Emergency Department: Payer: Medicare HMO

## 2019-08-11 DIAGNOSIS — Z79899 Other long term (current) drug therapy: Secondary | ICD-10-CM | POA: Diagnosis not present

## 2019-08-11 DIAGNOSIS — Z87891 Personal history of nicotine dependence: Secondary | ICD-10-CM | POA: Diagnosis not present

## 2019-08-11 DIAGNOSIS — R778 Other specified abnormalities of plasma proteins: Secondary | ICD-10-CM | POA: Insufficient documentation

## 2019-08-11 DIAGNOSIS — Z7984 Long term (current) use of oral hypoglycemic drugs: Secondary | ICD-10-CM | POA: Insufficient documentation

## 2019-08-11 DIAGNOSIS — R0600 Dyspnea, unspecified: Secondary | ICD-10-CM | POA: Insufficient documentation

## 2019-08-11 DIAGNOSIS — I10 Essential (primary) hypertension: Secondary | ICD-10-CM | POA: Diagnosis not present

## 2019-08-11 DIAGNOSIS — E119 Type 2 diabetes mellitus without complications: Secondary | ICD-10-CM | POA: Insufficient documentation

## 2019-08-11 DIAGNOSIS — Z20822 Contact with and (suspected) exposure to covid-19: Secondary | ICD-10-CM | POA: Diagnosis not present

## 2019-08-11 DIAGNOSIS — R0602 Shortness of breath: Secondary | ICD-10-CM | POA: Diagnosis present

## 2019-08-11 LAB — BASIC METABOLIC PANEL
Anion gap: 5 (ref 5–15)
BUN: 28 mg/dL — ABNORMAL HIGH (ref 8–23)
CO2: 30 mmol/L (ref 22–32)
Calcium: 9.4 mg/dL (ref 8.9–10.3)
Chloride: 101 mmol/L (ref 98–111)
Creatinine, Ser: 0.95 mg/dL (ref 0.61–1.24)
GFR calc Af Amer: >60 mL/min (ref >60)
GFR calc non Af Amer: >60 mL/min (ref >60)
Glucose, Bld: 159 mg/dL — ABNORMAL HIGH (ref 70–99)
Potassium: 4.5 mmol/L (ref 3.5–5.1)
Sodium: 136 mmol/L (ref 135–145)

## 2019-08-11 LAB — CBC
HCT: 42.2 % (ref 39.0–52.0)
Hemoglobin: 13.7 g/dL (ref 13.0–17.0)
MCH: 26.2 pg (ref 26.0–34.0)
MCHC: 32.5 g/dL (ref 30.0–36.0)
MCV: 80.8 fL (ref 80.0–100.0)
Platelets: 191 10*3/uL (ref 150–400)
RBC: 5.22 MIL/uL (ref 4.22–5.81)
RDW: 13.2 % (ref 11.5–15.5)
WBC: 7.9 10*3/uL (ref 4.0–10.5)
nRBC: 0 % (ref 0.0–0.2)

## 2019-08-11 LAB — TROPONIN I (HIGH SENSITIVITY): Troponin I (High Sensitivity): 36 ng/L — ABNORMAL HIGH (ref <18)

## 2019-08-11 MED ORDER — SODIUM CHLORIDE 0.9% FLUSH: 3.0000 mL | Freq: Once | INTRAVENOUS | Status: DC

## 2019-08-11 NOTE — ED Provider Notes (Signed)
[facility] Emergency Department Provider Note  ____________________________________________   First MD Initiated Contact with Patient 08/11/19 2258     (approximate)  I have reviewed the triage vital signs and the nursing notes.   HISTORY  Chief Complaint Shortness of Breath and Dizziness    HPI Lawrence Hart is a 74 y.o. male who is generally healthy for his age other than a diagnosis of diabetes controlled with Metformin.  He presents tonight by private vehicle for evaluation of 2 episodes of shortness of breath over the course of the last day.  He reports that he went golfing in the morning and did not feel well although his malaise was nonspecific.  At one point during the round of golf he started to feel short of breath while walking up a hill which is not typical for him.  He remained out of breath and decided to cut the round of golf short after 9 holes whereas he typically plays a full 18-hole round of golf.  He felt better and had a normal lunch and afternoon, but then in the evening around dinnertime he once again felt short of breath, this time while at rest.  The symptoms persisted more than a few minutes so he decided to come for evaluation.  He also felt subjective chills but no fever.    At no point  did he have any chest pain.  He also denies sore throat, loss of smell and taste, cough, nausea, vomiting, abdominal pain, and dysuria.  He has no history of cardiac disease including both coronary artery disease and CHF.  He has no history of lung disease and although he is a former smoker it has been almost 40 years since he smoked.  He has no diagnosis of COPD nor asthma.  He is generally healthy and active and he received his first COVID-19 vaccination 12 days ago.  Exertion seemed to make the symptoms worse initially although they happen at rest in the evening; nothing in particular made his symptoms better.  He has had no known COVID-19  contacts.        Past Medical History:  Diagnosis Date  . Arthritis    hands, knees  . Diabetes mellitus without complication ([place])   . Hypertension   . Prostate hypertrophy   . Wears dentures    full upper and lower. anchored on implants    Patient Active Problem List   Diagnosis Date Noted  . BPH with urinary obstruction 02/12/2017  . Type 2 diabetes mellitus with microalbuminuria, without long-term current use of insulin ([place]) 01/23/2017  . Hyperlipidemia, unspecified 01/23/2017  . History of colon polyps 01/23/2017  . Elevated prostate specific antigen (PSA) 01/23/2017  . Hematuria 01/12/2017  . Benign prostatic hyperplasia with urinary retention 01/09/2017  . Microalbuminuria 03/29/2015    Past Surgical History:  Procedure Laterality Date  . CATARACT EXTRACTION W/PHACO Right 04/02/2018   Procedure: CATARACT EXTRACTION PHACO AND INTRAOCULAR LENS PLACEMENT ([place]) RIGHT DIABETES;  Surgeon: Leandrew Koyanagi, MD;  Location: [facility];  Service: Ophthalmology;  Laterality: Right;  Diabetic - oral meds  . COLONOSCOPY    . dental implants    . HOLEP-LASER ENUCLEATION OF THE PROSTATE WITH MORCELLATION N/A 02/12/2017   Procedure: HOLEP-LASER ENUCLEATION OF THE PROSTATE WITH MORCELLATION;  Surgeon: Hollice Espy, MD;  Location: [facility];  Service: Urology;  Laterality: N/A;  . HOLMIUM LASER APPLICATION N/A [id number]   Procedure: HOLMIUM LASER APPLICATION;  Surgeon: Hollice Espy, MD;  Location: [facility];  Service: Urology;  Laterality: N/A;  . MOUTH SURGERY      Prior to Admission medications   Medication Sig Start Date End Date Taking? Authorizing Provider  acetaminophen (TYLENOL) 500 MG tablet Take 1,000 mg by mouth every 8 (eight) hours as needed.    [provider]  atorvastatin (LIPITOR) 10 MG tablet Take 10 mg by mouth every evening.    [provider]  Calcium Carb-Cholecalciferol (CALCIUM 600 + D PO) Take 1 tablet by mouth daily with  supper.    [provider]  glimepiride (AMARYL) 1 MG tablet Take 1 tablet by mouth daily. 02/02/17   [provider]  hydrocortisone cream 1 % Apply 1 application topically as needed for itching.    [provider]  latanoprost (XALATAN) 0.005 % ophthalmic solution INSTILL 1 DROP INTO LEFT EYE AT BEDTIME 01/07/19   [provider]  losartan (COZAAR) 25 MG tablet Take 12.5 mg by mouth daily.     [provider]  metFORMIN (GLUCOPHAGE) 500 MG tablet Take 500 mg by mouth 2 (two) times daily.    [provider]  Multiple Vitamin (MULTIVITAMIN WITH MINERALS) TABS tablet Take 1 tablet by mouth daily.    [provider]  vitamin C (ASCORBIC ACID) 500 MG tablet Take 500 mg by mouth daily with supper.    [provider]    Allergies Other  Family History  Problem Relation Age of Onset  . Kidney disease Mother   . Breast cancer Mother   . Heart failure Mother   . Heart disease Father   . Prostate cancer Neg Hx   . Bladder Cancer Neg Hx   . Kidney cancer Neg Hx     Social History Social History   Tobacco Use  . Smoking status: Former Smoker    Quit date: 1990    Years since quitting: 31.1  . Smokeless tobacco: Never Used  Substance Use Topics  . Alcohol use: Yes    Alcohol/week: 2.0 standard drinks    Types: 2 Standard drinks or equivalent per week    Comment: occassionally  . Drug use: No    Review of Systems Constitutional: Chills this evening.  General, nonspecific malaise. Eyes: No visual changes. ENT: No sore throat. Cardiovascular: Denies chest pain. Respiratory: Shortness of breath episodes x2, first with exertion and the second at rest. Gastrointestinal: No abdominal pain.  No nausea, no vomiting.  No diarrhea.  No constipation. Genitourinary: Negative for dysuria. Musculoskeletal: Negative for neck pain.  Negative for back pain. Integumentary: Negative for rash. Neurological: Negative for headaches,  focal weakness or numbness.   ____________________________________________   PHYSICAL EXAM:  VITAL SIGNS: ED Triage Vitals  Enc Vitals Group     BP 08/11/19 2049 (!) 182/94     Pulse Rate 08/11/19 2049 (!) 102     Resp 08/11/19 2049 17     Temp 08/11/19 2049 97.6 F (36.4 C)     Temp Source 08/11/19 2049 Oral     SpO2 08/11/19 2049 96 %     Weight 08/11/19 2054 88.5 kg (195 lb)     Height 08/11/19 2054 1.829 m (6')     Head Circumference --      Peak Flow --      Pain Score 08/11/19 2054 0     Pain Loc --      Pain Edu? --      Excl. in [place]? --     Constitutional:  Alert and oriented.  Appears younger than chronological age.  No distress at this time. Eyes: Conjunctivae are normal.  Head: Atraumatic. Nose: No congestion/rhinnorhea. Mouth/Throat: Patient is wearing a mask. Neck: No stridor.  No meningeal signs.   Cardiovascular: Normal rate, regular rhythm. Good peripheral circulation. Grossly normal heart sounds. Respiratory: Normal respiratory effort.  No retractions. Gastrointestinal: Soft and nontender. No distention.  Musculoskeletal: No lower extremity tenderness nor edema. No gross deformities of extremities. Neurologic:  Normal speech and language. No gross focal neurologic deficits are appreciated.  Skin:  Skin is warm, dry and intact. Psychiatric: Mood and affect are normal. Speech and behavior are normal.  ____________________________________________   LABS (all labs ordered are listed, but only abnormal results are displayed)  Labs Reviewed  BASIC METABOLIC PANEL - Abnormal; Notable for the following components:      Result Value   Glucose, Bld 159 (*)    BUN 28 (*)    All other components within normal limits  TROPONIN I (HIGH SENSITIVITY) - Abnormal; Notable for the following components:   Troponin I (High Sensitivity) 36 (*)    All other components within normal limits  TROPONIN I (HIGH SENSITIVITY) - Abnormal; Notable for the following components:    Troponin I (High Sensitivity) 32 (*)    All other components within normal limits  RESPIRATORY PANEL BY RT PCR (FLU A&B, COVID)  CBC   ____________________________________________  EKG  ED ECG REPORT I, Hinda Kehr, the attending physician, personally viewed and interpreted this ECG.  Date: 08/11/2019 EKG Time: 20: 49 Rate: 101 Rhythm: Sinus tachycardia, borderline, with premature atrial complexes QRS Axis: normal Intervals: normal ST/T Wave abnormalities: normal Narrative Interpretation: no evidence of acute ischemia  ____________________________________________  RADIOLOGY I, Hinda Kehr, personally viewed and evaluated these images (plain radiographs) as part of my medical decision making, as well as reviewing the written report by the radiologist.  ED MD interpretation: No acute abnormalities, hyperinflation consistent with COPD.  Official radiology report(s): DG Chest 2 View  Result Date: 08/11/2019 CLINICAL DATA:  Shortness of breath and dizziness EXAM: CHEST - 2 VIEW COMPARISON:  None FINDINGS: Cardiac shadow is within normal limits. The lungs are hyperinflated consistent with COPD. No acute bony abnormality is noted. 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 Electronically Signed   By: Inez Catalina M.D.   On: 08/11/2019 21:25    ____________________________________________   PROCEDURES   Procedure(s) performed (including Critical Care):  Procedures   ____________________________________________   INITIAL IMPRESSION / MDM / [place] / ED COURSE  As part of my medical decision making, I reviewed the following data within the [facility] notes reviewed and incorporated, Labs reviewed , EKG interpreted , Old chart reviewed, Radiograph reviewed , Notes from prior ED visits and [place] Controlled Substance Database   Differential diagnosis includes, but is not limited to, ACS, COVID-19, PE, community-acquired pneumonia, heart  failure (new onset), aortic dissection.  Patient is generally well-appearing.  He is hypertensive but some of that may be nerves.  EKG is nonischemic.  Patient is afebrile and initially in triage was very slightly tachycardic but now has a heart rate in the 70s to 80s.  He has no leukocytosis and his basic metabolic panel is essentially normal other than a very slightly elevated BUN.  Of note, however, his first high-sensitivity troponin is 36.  In the absence of chest pain and with a low HEAR score, this is somewhat nonspecific but bears repeating.  Given that he  is still active and in contact with others and having isolated chest pain with chills, I will check a Covid swab, specifically the 2-hour respiratory panel in case he does rule in for a cardiac cause and need intervention.  I will also repeat a troponin.  He does not want to stay in the hospital if it is not necessary to do so so I will reassess and discuss with him the plan after getting the results back of his second troponin.  Patient is in no distress at this time.      Clinical Course as of Aug 11 52  Tue Aug 12, 2019  0048 SARS Coronavirus 2 by RT PCR: NEGATIVE [CF]  [phone] The patient says he is feeling well and better than he did before.  He has had no shortness of breath during the 4 hours he has been in the emergency department.  He has not had chest pain at any point.  I discussed with him the slight elevation in his troponin level and explained that I could admit him to the hospital but I do not think it is absolutely necessary.  He again expressed that he would rather go home if possible and I think that is appropriate but encouraged him to follow-up with his primary care doctor and/or with cardiology at the next available opportunity.  I also gave my usual and customary return precautions and he understands and agrees with the plan.   [CF]    Clinical Course User Index [CF] Hinda Kehr, MD      ____________________________________________  FINAL CLINICAL IMPRESSION(S) / ED DIAGNOSES  Final diagnoses:  Dyspnea, unspecified type  Essential hypertension  Elevated troponin level     MEDICATIONS GIVEN DURING THIS VISIT:  Medications  aspirin chewable tablet 324 mg (has no administration in time range)     ED Discharge Orders    None      *Please note:  TYSEAN FORSHEE was evaluated in Emergency Department on 08/12/2019 for the symptoms described in the history of present illness. He was evaluated in the context of the global COVID-19 pandemic, which necessitated consideration that the patient might be at risk for infection with the SARS-CoV-2 virus that causes COVID-19. Institutional protocols and algorithms that pertain to the evaluation of patients at risk for COVID-19 are in a state of rapid change based on information released by regulatory bodies including the CDC and federal and state organizations. These policies and algorithms were followed during the patient's care in the ED.  Some ED evaluations and interventions may be delayed as a result of limited staffing during the pandemic.*  Note:  This document was prepared using Dragon voice recognition software and may include unintentional dictation errors.   Hinda Kehr, MD 08/12/19 [phone]

## 2019-08-11 NOTE — ED Triage Notes (Addendum)
Pt arrives to ED via POV from home with c/o dizziness and SHOB since 11am this morning. Pt reports he was playing golf this morning when the s/x's started, "I was walking up a hill and felt short of breath". Pt reports s/x's quickly subsided, but then returned this evening after dinner which caused him to seek medical evaluation. Pt denies CP, "no arm pains"; pt denies N/V/D, no cough. Pt is A&O, in NAD; RR even, regular, and unlabored.

## 2019-08-12 LAB — RESPIRATORY PANEL BY RT PCR (FLU A&B, COVID)
Influenza A by PCR: NEGATIVE
Influenza B by PCR: NEGATIVE
SARS Coronavirus 2 by RT PCR: NEGATIVE

## 2019-08-12 LAB — TROPONIN I (HIGH SENSITIVITY): Troponin I (High Sensitivity): 32 ng/L — ABNORMAL HIGH (ref <18)

## 2019-08-12 MED ORDER — ASPIRIN 81 MG PO CHEW
324.0000 mg | CHEWABLE_TABLET | Freq: Once | ORAL | Status: AC
  Administered 2019-08-12: 324 mg via ORAL
  Filled 2019-08-12: qty 4

## 2019-08-12 NOTE — Discharge Instructions (Signed)
Your workup in the Emergency Department today was reassuring.  We did not find any specific abnormalities other than a very slightly elevated blood test which is called the high-sensitivity troponin.  Typically this is elevated because of some strain on your heart, but we repeated the test while you were in the emergency department and the value was actually coming down.  While there is no reason to keep you in the hospital at this time, I do recommend that you follow-up with your doctor and/or with cardiology at the next available opportunity for additional evaluation.  If you do not already take a daily baby aspirin, I encourage you to do so at least until you follow-up with your doctor.  Continue taking your regular medications.  As we discussed, your Covid 19 test was negative today as well which is also reassuring.  Return to the Emergency Department if you develop new or worsening symptoms that concern you.

## 2019-09-19 ENCOUNTER — Other Ambulatory Visit
Admission: RE | Admit: 2019-09-19 | Discharge: 2019-09-19 | Disposition: A | Discharge status: Home / Self Care | Payer: Medicare HMO | Source: Ambulatory Visit | Attending: Internal Medicine | Admitting: Internal Medicine

## 2019-09-19 DIAGNOSIS — Z01812 Encounter for preprocedural laboratory examination: Secondary | ICD-10-CM | POA: Insufficient documentation

## 2019-09-19 DIAGNOSIS — I200 Unstable angina: Secondary | ICD-10-CM | POA: Insufficient documentation

## 2019-09-19 DIAGNOSIS — Z87891 Personal history of nicotine dependence: Secondary | ICD-10-CM | POA: Diagnosis not present

## 2019-09-19 DIAGNOSIS — Z79899 Other long term (current) drug therapy: Secondary | ICD-10-CM | POA: Diagnosis present

## 2019-09-19 DIAGNOSIS — Z7982 Long term (current) use of aspirin: Secondary | ICD-10-CM | POA: Insufficient documentation

## 2019-09-19 DIAGNOSIS — I10 Essential (primary) hypertension: Secondary | ICD-10-CM | POA: Diagnosis not present

## 2019-09-19 DIAGNOSIS — E119 Type 2 diabetes mellitus without complications: Secondary | ICD-10-CM | POA: Diagnosis not present

## 2019-09-19 DIAGNOSIS — E785 Hyperlipidemia, unspecified: Secondary | ICD-10-CM | POA: Insufficient documentation

## 2019-09-19 DIAGNOSIS — Z7984 Long term (current) use of oral hypoglycemic drugs: Secondary | ICD-10-CM | POA: Insufficient documentation

## 2019-09-19 LAB — BRAIN NATRIURETIC PEPTIDE: B Natriuretic Peptide: 29 pg/mL (ref 0.0–100.0)

## 2019-10-06 ENCOUNTER — Other Ambulatory Visit
Admission: RE | Admit: 2019-10-06 | Discharge: 2019-10-06 | Disposition: A | Discharge status: Home / Self Care | Payer: Medicare HMO | Source: Ambulatory Visit | Attending: Internal Medicine | Admitting: Internal Medicine

## 2019-10-06 DIAGNOSIS — Z20822 Contact with and (suspected) exposure to covid-19: Secondary | ICD-10-CM | POA: Insufficient documentation

## 2019-10-06 DIAGNOSIS — Z01812 Encounter for preprocedural laboratory examination: Secondary | ICD-10-CM | POA: Diagnosis present

## 2019-10-06 LAB — SARS CORONAVIRUS 2 (TAT 6-24 HRS): SARS Coronavirus 2: NEGATIVE

## 2019-10-08 ENCOUNTER — Ambulatory Visit
Admission: RE | Admit: 2019-10-08 | Discharge: 2019-10-08 | Disposition: A | Discharge status: Home / Self Care | Payer: Medicare HMO | Attending: Internal Medicine | Admitting: Internal Medicine

## 2019-10-08 ENCOUNTER — Encounter: Payer: Self-pay | Admitting: Internal Medicine

## 2019-10-08 ENCOUNTER — Encounter
Admission: RE | Disposition: A | Discharge status: Home / Self Care | Payer: Self-pay | Source: Home / Self Care | Attending: Internal Medicine

## 2019-10-08 ENCOUNTER — Other Ambulatory Visit: Payer: Self-pay

## 2019-10-08 DIAGNOSIS — I200 Unstable angina: Secondary | ICD-10-CM

## 2019-10-08 DIAGNOSIS — Z7982 Long term (current) use of aspirin: Secondary | ICD-10-CM | POA: Diagnosis not present

## 2019-10-08 DIAGNOSIS — I25110 Atherosclerotic heart disease of native coronary artery with unstable angina pectoris: Secondary | ICD-10-CM | POA: Insufficient documentation

## 2019-10-08 DIAGNOSIS — Z87891 Personal history of nicotine dependence: Secondary | ICD-10-CM | POA: Diagnosis not present

## 2019-10-08 DIAGNOSIS — E7800 Pure hypercholesterolemia, unspecified: Secondary | ICD-10-CM | POA: Diagnosis not present

## 2019-10-08 DIAGNOSIS — R0602 Shortness of breath: Secondary | ICD-10-CM | POA: Insufficient documentation

## 2019-10-08 DIAGNOSIS — Z7984 Long term (current) use of oral hypoglycemic drugs: Secondary | ICD-10-CM | POA: Insufficient documentation

## 2019-10-08 DIAGNOSIS — I10 Essential (primary) hypertension: Secondary | ICD-10-CM | POA: Diagnosis not present

## 2019-10-08 DIAGNOSIS — Z79899 Other long term (current) drug therapy: Secondary | ICD-10-CM | POA: Insufficient documentation

## 2019-10-08 DIAGNOSIS — E785 Hyperlipidemia, unspecified: Secondary | ICD-10-CM | POA: Diagnosis not present

## 2019-10-08 DIAGNOSIS — E119 Type 2 diabetes mellitus without complications: Secondary | ICD-10-CM | POA: Insufficient documentation

## 2019-10-08 DIAGNOSIS — N400 Enlarged prostate without lower urinary tract symptoms: Secondary | ICD-10-CM | POA: Insufficient documentation

## 2019-10-08 HISTORY — PX: LEFT HEART CATH AND CORONARY ANGIOGRAPHY: CATH118249

## 2019-10-08 SURGERY — LEFT HEART CATH AND CORONARY ANGIOGRAPHY
Anesthesia: Moderate Sedation | Laterality: Left

## 2019-10-08 MED ORDER — HEPARIN SODIUM (PORCINE) 1000 UNIT/ML IJ SOLN
INTRAMUSCULAR | Status: AC
  Filled 2019-10-08: qty 1

## 2019-10-08 MED ORDER — VERAPAMIL HCL 2.5 MG/ML IV SOLN
INTRAVENOUS | Status: AC
  Filled 2019-10-08: qty 2

## 2019-10-08 MED ORDER — FENTANYL CITRATE (PF) 100 MCG/2ML IJ SOLN
INTRAMUSCULAR | Status: AC
  Filled 2019-10-08: qty 2

## 2019-10-08 MED ORDER — VERAPAMIL HCL 2.5 MG/ML IV SOLN
INTRAVENOUS | Status: DC | PRN
  Administered 2019-10-08: 2.5 mg via INTRACORONARY

## 2019-10-08 MED ORDER — IOHEXOL 300 MG/ML  SOLN
INTRAMUSCULAR | Status: DC | PRN
  Administered 2019-10-08: 90 mL

## 2019-10-08 MED ORDER — HEPARIN (PORCINE) IN NACL 1000-0.9 UT/500ML-% IV SOLN
INTRAVENOUS | Status: DC | PRN
  Administered 2019-10-08: 500 mL

## 2019-10-08 MED ORDER — ASPIRIN 81 MG PO CHEW: 81.0000 mg | CHEWABLE_TABLET | ORAL | Status: DC

## 2019-10-08 MED ORDER — SODIUM CHLORIDE 0.9 % WEIGHT BASED INFUSION: 1.0000 mL/kg/h | INTRAVENOUS | Status: DC

## 2019-10-08 MED ORDER — FENTANYL CITRATE (PF) 100 MCG/2ML IJ SOLN
INTRAMUSCULAR | Status: DC | PRN
  Administered 2019-10-08: 50 ug via INTRAVENOUS

## 2019-10-08 MED ORDER — SODIUM CHLORIDE 0.9 % IV SOLN: 250.0000 mL | INTRAVENOUS | Status: DC | PRN

## 2019-10-08 MED ORDER — MIDAZOLAM HCL 2 MG/2ML IJ SOLN
INTRAMUSCULAR | Status: AC
  Filled 2019-10-08: qty 2

## 2019-10-08 MED ORDER — HEPARIN SODIUM (PORCINE) 1000 UNIT/ML IJ SOLN
INTRAMUSCULAR | Status: DC | PRN
  Administered 2019-10-08: 4500 [IU] via INTRAVENOUS

## 2019-10-08 MED ORDER — SODIUM CHLORIDE 0.9 % WEIGHT BASED INFUSION
3.0000 mL/kg/h | INTRAVENOUS | Status: AC
  Administered 2019-10-08: 13:00:00 3 mL/kg/h via INTRAVENOUS

## 2019-10-08 MED ORDER — SODIUM CHLORIDE 0.9% FLUSH: 3.0000 mL | Freq: Two times a day (BID) | INTRAVENOUS | Status: DC

## 2019-10-08 MED ORDER — MIDAZOLAM HCL 2 MG/2ML IJ SOLN
INTRAMUSCULAR | Status: DC | PRN
  Administered 2019-10-08: 1 mg via INTRAVENOUS

## 2019-10-08 MED ORDER — HEPARIN (PORCINE) IN NACL 1000-0.9 UT/500ML-% IV SOLN
INTRAVENOUS | Status: AC
  Filled 2019-10-08: qty 1000

## 2019-10-08 MED ORDER — SODIUM CHLORIDE 0.9% FLUSH: 3.0000 mL | INTRAVENOUS | Status: DC | PRN

## 2019-10-08 SURGICAL SUPPLY — 8 items
CATH INFINITI 5 FR JL3.5 (CATHETERS) ×3 IMPLANT
CATH INFINITI JR4 5F (CATHETERS) ×3 IMPLANT
DEVICE RAD TR BAND REGULAR (VASCULAR PRODUCTS) ×3 IMPLANT
GLIDESHEATH SLEND SS 6F .021 (SHEATH) ×3 IMPLANT
GUIDEWIRE INQWIRE 1.5J.035X260 (WIRE) ×1 IMPLANT
INQWIRE 1.5J .035X260CM (WIRE) ×3
KIT MANI 3VAL PERCEP (MISCELLANEOUS) ×3 IMPLANT
PACK CARDIAC CATH (CUSTOM PROCEDURE TRAY) ×3 IMPLANT

## 2019-10-08 NOTE — Discharge Instructions (Signed)
Radial Site Care Refer to this sheet in the next few weeks. These instructions provide you with information about caring for yourself after your procedure. Your health care provider may also give you more specific instructions. Your treatment has been planned according to current medical practices, but problems sometimes occur. Call your health care provider if you have any problems or questions after your procedure. What can I expect after the procedure? After your procedure, it is typical to have the following: Bruising at the radial site that usually fades within 1-2 weeks. Blood collecting in the tissue (hematoma) that may be painful to the touch. It should usually decrease in size and tenderness within 1-2 weeks.  Follow these instructions at home: Take medicines only as directed by your health care provider. If you are on a medication called Metformin please do not take for 48 hours after your procedure. Over the next 48hrs please increase your fluid intake of water and non caffeine beverages to flush the contrast dye out of your system.  You may shower 24 hours after the procedure  Leave your bandage on and gently wash the site with plain soap and water. Pat the area dry with a clean towel. Do not rub the site, because this may cause bleeding.  Remove your dressing 48hrs after your procedure and leave open to air.  Do not submerge your site in water for 7 days. This includes swimming and washing dishes.  Check your insertion site every day for redness, swelling, or drainage. Do not apply powder or lotion to the site. Do not flex or bend the affected arm for 24 hours or as directed by your health care provider. Do not push or pull heavy objects with the affected arm for 24 hours or as directed by your health care provider. Do not lift over 10 lb (4.5 kg) for 5 days after your procedure or as directed by your health care provider. Ask your health care provider when it is okay to: Return to  work or school. Resume usual physical activities or sports. Resume sexual activity. Do not drive home if you are discharged the same day as the procedure. Have someone else drive you. You may drive 48 hours after the procedure Do not operate machinery or power tools for 24 hours after the procedure. If your procedure was done as an outpatient procedure, which means that you went home the same day as your procedure, a responsible adult should be with you for the first 24 hours after you arrive home. Keep all follow-up visits as directed by your health care provider. This is important. Contact a health care provider if: You have a fever. You have chills. You have increased bleeding from the radial site. Hold pressure on the site. Get help right away if: You have unusual pain at the radial site. You have redness, warmth, or swelling at the radial site. You have drainage (other than a small amount of blood on the dressing) from the radial site. The radial site is bleeding, and the bleeding does not stop after 15 minutes of holding steady pressure on the site. Your arm or hand becomes pale, cool, tingly, or numb. This information is not intended to replace advice given to you by your health care provider. Make sure you discuss any questions you have with your health care provider. Document Released: 07/29/2010 Document Revised: 12/02/2015 Document Reviewed: 01/12/2014 Elsevier Interactive Patient Education  2018 [employer]  

## 2019-10-09 ENCOUNTER — Encounter: Payer: Self-pay | Admitting: Cardiology

## 2019-10-30 ENCOUNTER — Other Ambulatory Visit: Payer: Self-pay | Admitting: Internal Medicine

## 2019-10-30 DIAGNOSIS — R1032 Left lower quadrant pain: Secondary | ICD-10-CM

## 2019-11-11 ENCOUNTER — Ambulatory Visit: Payer: Medicare HMO

## 2019-11-13 ENCOUNTER — Ambulatory Visit: Payer: Medicare HMO

## 2019-11-14 ENCOUNTER — Other Ambulatory Visit: Payer: Self-pay | Admitting: Internal Medicine

## 2019-11-14 DIAGNOSIS — K4030 Unilateral inguinal hernia, with obstruction, without gangrene, not specified as recurrent: Secondary | ICD-10-CM

## 2019-11-14 DIAGNOSIS — R1032 Left lower quadrant pain: Secondary | ICD-10-CM

## 2019-11-18 ENCOUNTER — Ambulatory Visit: Payer: Medicare HMO

## 2019-11-19 ENCOUNTER — Other Ambulatory Visit: Payer: Self-pay | Admitting: Internal Medicine

## 2019-11-19 DIAGNOSIS — K4030 Unilateral inguinal hernia, with obstruction, without gangrene, not specified as recurrent: Secondary | ICD-10-CM

## 2019-11-19 DIAGNOSIS — R1031 Right lower quadrant pain: Secondary | ICD-10-CM

## 2019-11-20 ENCOUNTER — Other Ambulatory Visit: Payer: Self-pay

## 2019-11-20 ENCOUNTER — Ambulatory Visit
Admission: RE | Admit: 2019-11-20 | Discharge: 2019-11-20 | Disposition: A | Discharge status: Home / Self Care | Payer: Medicare HMO | Source: Ambulatory Visit | Attending: Internal Medicine | Admitting: Internal Medicine

## 2019-11-20 DIAGNOSIS — K4030 Unilateral inguinal hernia, with obstruction, without gangrene, not specified as recurrent: Secondary | ICD-10-CM | POA: Diagnosis present

## 2019-11-20 DIAGNOSIS — R1031 Right lower quadrant pain: Secondary | ICD-10-CM

## 2019-11-20 DIAGNOSIS — R1032 Left lower quadrant pain: Secondary | ICD-10-CM

## 2021-08-30 ENCOUNTER — Encounter: Payer: Self-pay | Admitting: Ophthalmology

## 2021-09-05 NOTE — Discharge Instructions (Signed)

## 2021-09-07 ENCOUNTER — Other Ambulatory Visit: Payer: Self-pay

## 2021-09-07 ENCOUNTER — Encounter
Admission: RE | Disposition: A | Discharge status: Home / Self Care | Payer: Self-pay | Source: Home / Self Care | Attending: Ophthalmology

## 2021-09-07 ENCOUNTER — Ambulatory Visit
Admission: RE | Admit: 2021-09-07 | Discharge: 2021-09-07 | Disposition: A | Discharge status: Home / Self Care | Payer: Medicare HMO | Attending: Ophthalmology | Admitting: Ophthalmology

## 2021-09-07 ENCOUNTER — Ambulatory Visit: Payer: Medicare HMO | Admitting: Anesthesiology

## 2021-09-07 ENCOUNTER — Encounter: Payer: Self-pay | Admitting: Ophthalmology

## 2021-09-07 DIAGNOSIS — H401121 Primary open-angle glaucoma, left eye, mild stage: Secondary | ICD-10-CM | POA: Insufficient documentation

## 2021-09-07 DIAGNOSIS — H2512 Age-related nuclear cataract, left eye: Secondary | ICD-10-CM | POA: Insufficient documentation

## 2021-09-07 DIAGNOSIS — I10 Essential (primary) hypertension: Secondary | ICD-10-CM | POA: Diagnosis not present

## 2021-09-07 DIAGNOSIS — E1136 Type 2 diabetes mellitus with diabetic cataract: Secondary | ICD-10-CM | POA: Diagnosis present

## 2021-09-07 DIAGNOSIS — Z87891 Personal history of nicotine dependence: Secondary | ICD-10-CM | POA: Diagnosis not present

## 2021-09-07 HISTORY — PX: CATARACT EXTRACTION W/PHACO: SHX586

## 2021-09-07 LAB — GLUCOSE, CAPILLARY
Glucose-Capillary: 121 mg/dL — ABNORMAL HIGH (ref 70–99)
Glucose-Capillary: 124 mg/dL — ABNORMAL HIGH (ref 70–99)

## 2021-09-07 SURGERY — PHACOEMULSIFICATION, CATARACT, WITH IOL INSERTION
Anesthesia: Monitor Anesthesia Care | Site: Eye | Laterality: Left

## 2021-09-07 MED ORDER — SIGHTPATH DOSE#1 BSS IO SOLN
INTRAOCULAR | Status: DC | PRN
  Administered 2021-09-07: 15 mL

## 2021-09-07 MED ORDER — CEFUROXIME OPHTHALMIC INJECTION 1 MG/0.1 ML
INJECTION | OPHTHALMIC | Status: DC | PRN
  Administered 2021-09-07: 1 mg via OPHTHALMIC

## 2021-09-07 MED ORDER — ACETAMINOPHEN 10 MG/ML IV SOLN: 1000.0000 mg | Freq: Once | INTRAVENOUS | Status: DC | PRN

## 2021-09-07 MED ORDER — ARMC OPHTHALMIC DILATING DROPS
1.0000 "application " | OPHTHALMIC | Status: DC | PRN
  Administered 2021-09-07 (×3): 1 via OPHTHALMIC

## 2021-09-07 MED ORDER — ONDANSETRON HCL 4 MG/2ML IJ SOLN: 4.0000 mg | Freq: Once | INTRAMUSCULAR | Status: DC | PRN

## 2021-09-07 MED ORDER — SIGHTPATH DOSE#1 NA HYALUR & NA CHOND-NA HYALUR IO KIT
PACK | INTRAOCULAR | Status: DC | PRN
  Administered 2021-09-07: 1 via OPHTHALMIC

## 2021-09-07 MED ORDER — FENTANYL CITRATE (PF) 100 MCG/2ML IJ SOLN
INTRAMUSCULAR | Status: DC | PRN
  Administered 2021-09-07: 50 ug via INTRAVENOUS

## 2021-09-07 MED ORDER — SIGHTPATH DOSE#1 BSS IO SOLN
INTRAOCULAR | Status: DC | PRN
  Administered 2021-09-07: 98 mL via OPHTHALMIC

## 2021-09-07 MED ORDER — TETRACAINE HCL 0.5 % OP SOLN
1.0000 [drp] | OPHTHALMIC | Status: DC | PRN
  Administered 2021-09-07 (×3): 1 [drp] via OPHTHALMIC

## 2021-09-07 MED ORDER — LACTATED RINGERS IV SOLN: INTRAVENOUS | Status: DC

## 2021-09-07 MED ORDER — SIGHTPATH DOSE#1 BSS IO SOLN
INTRAOCULAR | Status: DC | PRN
  Administered 2021-09-07: 1 mL via INTRAMUSCULAR

## 2021-09-07 MED ORDER — MIDAZOLAM HCL 2 MG/2ML IJ SOLN
INTRAMUSCULAR | Status: DC | PRN
Start: 2021-09-07 — End: 2021-09-07
  Administered 2021-09-07: 1 mg via INTRAVENOUS

## 2021-09-07 SURGICAL SUPPLY — 13 items
BLADE DUAL KAHOOK SINGLE USE (BLADE) ×1 IMPLANT
CATARACT SUITE SIGHTPATH (MISCELLANEOUS) ×2 IMPLANT
FEE CATARACT SUITE SIGHTPATH (MISCELLANEOUS) ×1 IMPLANT
GLOVE SRG 8 PF TXTR STRL LF DI (GLOVE) ×1 IMPLANT
GLOVE SURG ENC TEXT LTX SZ7.5 (GLOVE) ×2 IMPLANT
GLOVE SURG UNDER POLY LF SZ8 (GLOVE) ×2
LENS IOL ACRSF IQ ULTRA 17.0 (Intraocular Lens) IMPLANT
LENS IOL ACRYSOF IQ 17.0 (Intraocular Lens) ×2 IMPLANT
NDL FILTER BLUNT 18X1 1/2 (NEEDLE) ×1 IMPLANT
NEEDLE FILTER BLUNT 18X 1/2SAF (NEEDLE) ×1
NEEDLE FILTER BLUNT 18X1 1/2 (NEEDLE) ×1 IMPLANT
SYR 3ML LL SCALE MARK (SYRINGE) ×2 IMPLANT
WATER STERILE IRR 250ML POUR (IV SOLUTION) ×2 IMPLANT

## 2021-09-07 NOTE — Anesthesia Postprocedure Evaluation (Signed)
Anesthesia Post Note ? ?Patient: Lawrence Hart ? ?Procedure(s) Performed: CATARACT EXTRACTION PHACO AND INTRAOCULAR LENS PLACEMENT (IOC) LEFT DIABETIC [place] DUAL BLADE GONIOTOMY (Left: Eye) ? ? ?  ?Patient location during evaluation: PACU ?Anesthesia Type: MAC ?Level of consciousness: awake and alert ?Pain management: pain level controlled ?Vital Signs Assessment: post-procedure vital signs reviewed and stable ?Respiratory status: spontaneous breathing, nonlabored ventilation, respiratory function stable and patient connected to nasal cannula oxygen ?Cardiovascular status: stable and blood pressure returned to baseline ?Postop Assessment: no apparent nausea or vomiting ?Anesthetic complications: no ? ? ?No notable events documented. ? ?Fremon Zacharia A  Ciaran Begay ? ? ? ? ? ?

## 2021-09-07 NOTE — H&P (Signed)
?[facility]  ? ?Primary Care Physician:  Baxter Hire, MD ?Ophthalmologist: Dr. Leandrew Koyanagi ? ?Pre-Procedure History & Physical: ?HPI:  Lawrence Hart is a 76 y.o. male here for ophthalmic surgery. ?  ?Past Medical History:  ?Diagnosis Date  ? Arthritis   ? hands, knees  ? Diabetes mellitus without complication ([place])   ? Hypertension   ? Prostate hypertrophy   ? Wears dentures   ? full upper and lower. anchored on implants  ? ? ?Past Surgical History:  ?Procedure Laterality Date  ? CATARACT EXTRACTION W/PHACO Right 04/02/2018  ? Procedure: CATARACT EXTRACTION PHACO AND INTRAOCULAR LENS PLACEMENT ([place]) RIGHT DIABETES;  Surgeon: Leandrew Koyanagi, MD;  Location: [place];  Service: Ophthalmology;  Laterality: Right;  Diabetic - oral meds  ? COLONOSCOPY    ? dental implants    ? HOLEP-LASER ENUCLEATION OF THE PROSTATE WITH MORCELLATION N/A 02/12/2017  ? Procedure: HOLEP-LASER ENUCLEATION OF THE PROSTATE WITH MORCELLATION;  Surgeon: Hollice Espy, MD;  Location: [facility];  Service: Urology;  Laterality: N/A;  ? HOLMIUM LASER APPLICATION N/A 5/0/3546  ? Procedure: HOLMIUM LASER APPLICATION;  Surgeon: Hollice Espy, MD;  Location: [facility];  Service: Urology;  Laterality: N/A;  ? LEFT HEART CATH AND CORONARY ANGIOGRAPHY Left 10/08/2019  ? Procedure: LEFT HEART CATH AND CORONARY ANGIOGRAPHY;  Surgeon: Yolonda Kida, MD;  Location: [place] [facility];  Service: Cardiovascular;  Laterality: Left;  ? MOUTH SURGERY    ? ? ?Prior to Admission medications   ?Medication Sig Start Date End Date Taking? Authorizing Provider  ?acetaminophen (TYLENOL) 500 MG tablet Take 1,000 mg by mouth every 8 (eight) hours as needed.   Yes [provider]  ?aspirin EC 81 MG tablet Take 81 mg by mouth daily.   Yes [provider]  ?atorvastatin (LIPITOR) 10 MG tablet Take 10 mg by mouth every evening.   Yes [provider]  ?Calcium Carb-Cholecalciferol (CALCIUM 600 + D PO)  Take 1 tablet by mouth daily with supper.   Yes [provider]  ?glimepiride (AMARYL) 1 MG tablet Take 1 mg by mouth daily.  02/02/17  Yes [provider]  ?hydrocortisone cream 1 % Apply 1 application topically 3 (three) times daily as needed for itching.    Yes [provider]  ?latanoprost (XALATAN) 0.005 % ophthalmic solution Place 1 drop into the left eye at bedtime.  01/07/19  Yes [provider]  ?losartan (COZAAR) 25 MG tablet Take 12.5 mg by mouth daily.    Yes [provider]  ?metFORMIN (GLUCOPHAGE) 500 MG tablet Take 500 mg by mouth 2 (two) times daily.   Yes [provider]  ?Multiple Vitamin (MULTIVITAMIN WITH MINERALS) TABS tablet Take 1 tablet by mouth daily with supper.    Yes [provider]  ?vitamin C (ASCORBIC ACID) 500 MG tablet Take 500 mg by mouth daily with supper.   Yes [provider]  ?isosorbide mononitrate (IMDUR) 30 MG 24 hr tablet Take 30 mg by mouth daily. ?Patient not taking: Reported on 08/30/2021 09/12/19   [provider]  ?metoprolol tartrate (LOPRESSOR) 25 MG tablet Take 12.5 mg by mouth 2 (two) times daily. ?Patient not taking: Reported on 08/30/2021 09/12/19   [provider]  ?nitroGLYCERIN (NITROSTAT) 0.4 MG SL tablet Place 0.4 mg under the tongue every 5 (five) minutes x 3 doses as needed for chest pain. 09/19/19   [provider]  ? ? ?Allergies as of 08/05/2021 - Review Complete  10/08/2019  ?Allergen Reaction Noted  ? Other Nausea Only 01/30/2017  ? ? ?Family History  ?Problem Relation Age of Onset  ? Kidney disease Mother   ? Breast cancer Mother   ? Heart failure Mother   ? Heart disease Father   ? Prostate cancer Neg Hx   ? Bladder Cancer Neg Hx   ? Kidney cancer Neg Hx   ? ? ?Social History  ? ?Socioeconomic History  ? Marital status: Married  ?  Spouse name: Not on file  ? Number of children: Not on file  ? Years of education: Not on file  ? Highest education level: Not on  file  ?Occupational History  ? Occupation: retired  ?Tobacco Use  ? Smoking status: Former  ?  Types: Cigarettes  ?  Quit date: 20  ?  Years since quitting: 33.1  ? Smokeless tobacco: Never  ?Vaping Use  ? Vaping Use: Never used  ?Substance and Sexual Activity  ? Alcohol use: Yes  ?  Alcohol/week: 2.0 standard drinks  ?  Types: 2 Standard drinks or equivalent per week  ?  Comment: occassionally  ? Drug use: No  ? Sexual activity: Yes  ?Other Topics Concern  ? Not on file  ?Social History Narrative  ? Not on file  ? ?Social Determinants of Health  ? ?Financial Resource Strain: Not on file  ?Food Insecurity: Not on file  ?Transportation Needs: Not on file  ?Physical Activity: Not on file  ?Stress: Not on file  ?Social Connections: Not on file  ?Intimate Partner Violence: Not on file  ? ? ?Review of Systems: ?See HPI, otherwise negative ROS ? ?Physical Exam: ?BP (!) 176/90   Pulse 81   Temp 97.7 ?F (36.5 ?C) (Temporal)   Resp 16   Ht 5\' 11"  (1.803 m)   Wt 91.6 kg   SpO2 99%   BMI 28.17 kg/m?  ?General:   Alert,  pleasant and cooperative in NAD ?Head:  Normocephalic and atraumatic. ?Lungs:  Clear to auscultation.    ?Heart:  Regular rate and rhythm.  ? ?Impression/Plan: ?Lawrence Hart is here for ophthalmic surgery. ? ?Risks, benefits, limitations, and alternatives regarding ophthalmic surgery have been reviewed with the patient.  Questions have been answered.  All parties agreeable. ? ? Leandrew Koyanagi, MD  09/07/2021, 7:28 AM ? ?

## 2021-09-07 NOTE — Transfer of Care (Signed)
Immediate Anesthesia Transfer of Care Note ? ?Patient: Lawrence Hart ? ?Procedure(s) Performed: CATARACT EXTRACTION PHACO AND INTRAOCULAR LENS PLACEMENT (IOC) LEFT DIABETIC [place] DUAL BLADE GONIOTOMY (Left: Eye) ? ?Patient Location: PACU ? ?Anesthesia Type: MAC ? ?Level of Consciousness: awake, alert  and patient cooperative ? ?Airway and Oxygen Therapy: Patient Spontanous Breathing and Patient connected to supplemental oxygen ? ?Post-op Assessment: Post-op Vital signs reviewed, Patient's Cardiovascular Status Stable, Respiratory Function Stable, Patent Airway and No signs of Nausea or vomiting ? ?Post-op Vital Signs: Reviewed and stable ? ?Complications: No notable events documented. ? ?

## 2021-09-07 NOTE — Op Note (Signed)
PREOPERATIVE DIAGNOSIS:  Nuclear sclerotic cataract left eye. H25.12 ? 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 ?POSTOPERATIVE DIAGNOSIS:    Nuclear sclerotic cataract left eye.   ?  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 ?PROCEDURE:  Phacoemusification with posterior chamber intraocular lens placement of the left eye  ?[place] Dual Blade goniotomy left eye ? Ultrasound time: Procedure(s) with comments: ?CATARACT EXTRACTION PHACO AND INTRAOCULAR LENS PLACEMENT (IOC) LEFT DIABETIC K[place] DUAL BLADE GONIOTOMY (Left) - 4.97 ?01:02.1 ? ?LENS:  ?Implant Name Type Inv. Item Serial No. Manufacturer Lot No. LRB No. Used Action  ?LENS IOL ACRYSOF IQ 17.0 - [id number] Intraocular Lens LENS IOL ACRYSOF IQ 17.0 [id number] [employer]  Left 1 Implanted  ? ? ?SURGEON:  Wyonia Hough, MD ?  ?ANESTHESIA:  Topical with tetracaine drops augmented with 1% preservative-free intracameral lidocaine. ? ?  ?COMPLICATIONS:  None. ?  ?DESCRIPTION OF PROCEDURE:  The patient was identified in the holding room and transported to the operating room and placed in the supine position under the operating microscope.  The left eye was identified as the operative eye and it was prepped and draped in the usual sterile ophthalmic fashion. ?  ?A 1 millimeter clear-corneal paracentesis was made at the 5:30 position.  0.5 ml of preservative-free 1% lidocaine was injected into the anterior chamber. ? The anterior chamber was filled with Viscoat viscoelastic.  A 2.4 millimeter keratome was used to make a near-clear corneal incision at the 2:30 position. The microscope was adjusted and a gonioprism was used to visulaize the trabecular meshwork.  The [facility] Dual Blade was advanced across the anterior chamber under viscoelastic.  The blade was used to mark the trabecular meshwork at the 7:30 position.  The blade was placed two clock hours clockwise into the meshwork.  Proper postioning was confirmed.  The blade ws  passed counterclockwise through the meshwork to excise approximately two to three clock-hours of trabecular meshwork. ? ? A curvilinear capsulorrhexis was made with a cystotome and capsulorrhexis forceps.  Balanced salt solution was used to hydrodissect and hydrodelineate the nucleus. ?  ?Phacoemulsification was then used in stop and chop fashion to remove the lens nucleus and epinucleus.  The remaining cortex was then removed using the irrigation and aspiration handpiece. Provisc was then placed into the capsular bag to distend it for lens placement.  A lens was then injected into the capsular bag.  The remaining viscoelastic was aspirated. ?  ?Wounds were hydrated with balanced salt solution.  The anterior chamber was inflated to a physiologic pressure with balanced salt solution.  No wound leaks were noted. Cefuroxime 0.1 ml of a 10mg /ml solution was injected into the anterior chamber for a dose of 1 mg of intracameral antibiotic at the completion of the case. ? ?The patient was taken to the recovery room in stable condition without complications of anesthesia or surgery. ? ?

## 2021-09-07 NOTE — Anesthesia Preprocedure Evaluation (Signed)
Anesthesia Evaluation  ?Patient identified by MRN, date of birth, ID band ?Patient awake ? ? ? ?Reviewed: ?Allergy & Precautions, NPO status , Patient's Chart, lab work & pertinent test results, reviewed documented beta blocker date and time  ? ?History of Anesthesia Complications ?Negative for: history of anesthetic complications ? ?Airway ?Mallampati: III ? ?TM Distance: >3 FB ?Neck ROM: Full ? ?Mouth opening: Limited Mouth Opening ? Dental ? ?(+) Upper Dentures, Lower Dentures ?  ?Pulmonary ?former smoker,  ?  ?breath sounds clear to auscultation ? ? ? ? ? ? Cardiovascular ?hypertension, (-) angina(-) DOE  ?Rhythm:Regular Rate:Normal ? ? ?  ?Neuro/Psych ?  ? GI/Hepatic ?neg GERD  ,  ?Endo/Other  ?diabetes, Type 2 ? Renal/GU ?  ? ?  ?Musculoskeletal ? ?(+) Arthritis ,  ? Abdominal ?  ?Peds ? Hematology ?  ?Anesthesia Other Findings ? ? Reproductive/Obstetrics ? ?  ? ? ? ? ? ? ? ? ? ? ? ? ? ?  ?  ? ? ? ? ? ? ? ? ?Anesthesia Physical ?Anesthesia Plan ? ?ASA: 2 ? ?Anesthesia Plan: MAC  ? ?Post-op Pain Management:   ? ?Induction: Intravenous ? ?PONV Risk Score and Plan: 1 and TIVA, Midazolam and Treatment may vary due to age or medical condition ? ?Airway Management Planned: Nasal Cannula ? ?Additional Equipment:  ? ?Intra-op Plan:  ? ?Post-operative Plan:  ? ?Informed Consent: I have reviewed the patients History and Physical, chart, labs and discussed the procedure including the risks, benefits and alternatives for the proposed anesthesia with the patient or authorized representative who has indicated his/her understanding and acceptance.  ? ? ? ? ? ?Plan Discussed with: CRNA and Anesthesiologist ? ?Anesthesia Plan Comments:   ? ? ? ? ? ? ?Anesthesia Quick Evaluation ? ?

## 2021-09-08 ENCOUNTER — Encounter: Payer: Self-pay | Admitting: Ophthalmology

## 2022-02-07 IMAGING — US US EXTREM LOW*L* LIMITED
1 series · 11 of 11 positions shown · non-contrast
Comparison: None.

CLINICAL DATA: Bilateral lower quadrant abdominal pain for the past
2 weeks with a clinical concern for bilateral inguinal hernias.

EXAM:
ULTRASOUND BILATERAL INGUINAL REGIONS
TECHNIQUE: Ultrasound examination of the inguinal regions was performed
bilaterally.

[Series 1: us extrem low*left* limited · 0.09mm/px · 11 of 11 slices shown]
[im 1/11]
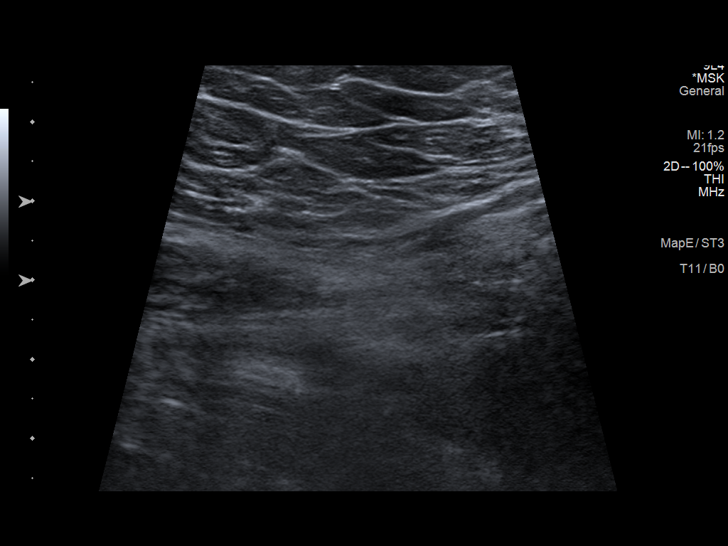
[im 2/11]
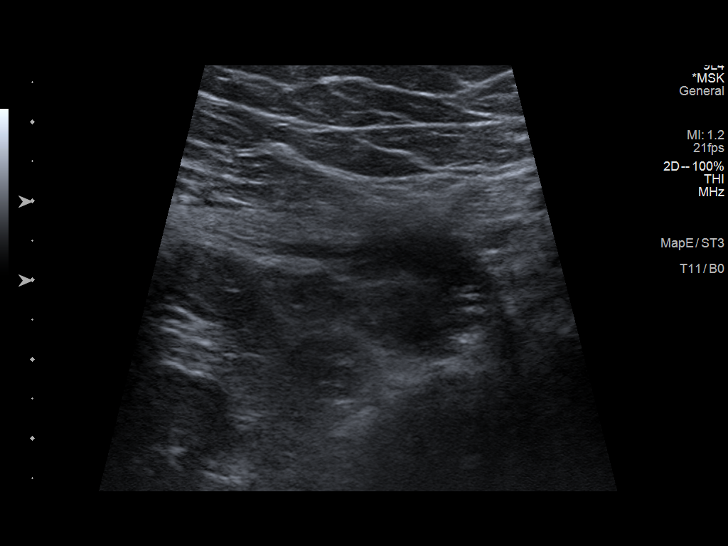
[im 3/11]
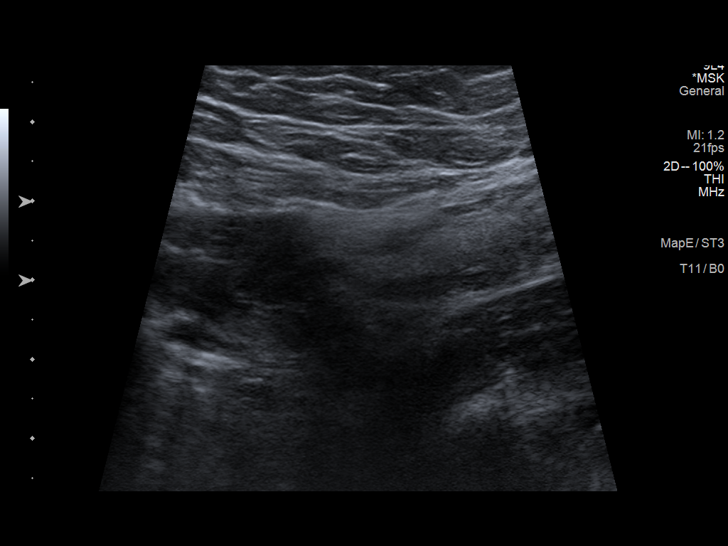
[im 4/11]
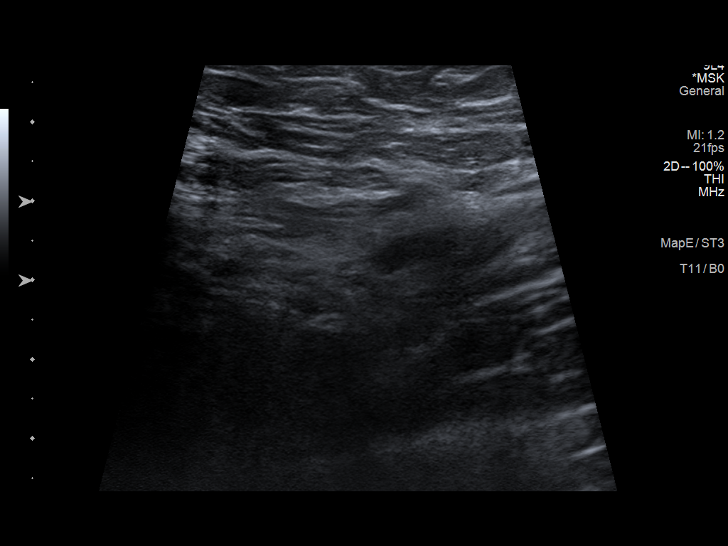
[im 5/11]
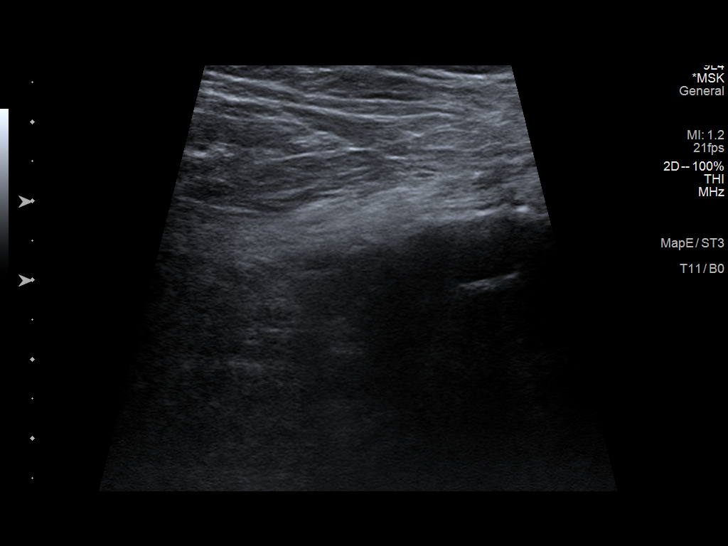
[im 6/11]
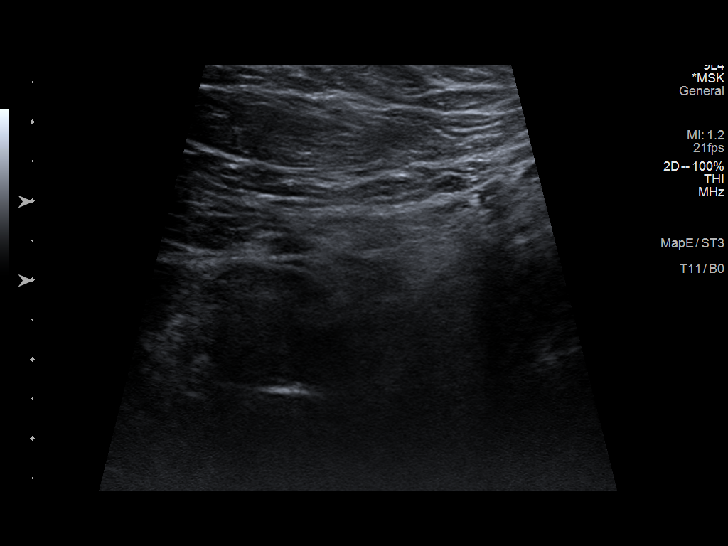
[im 7/11]
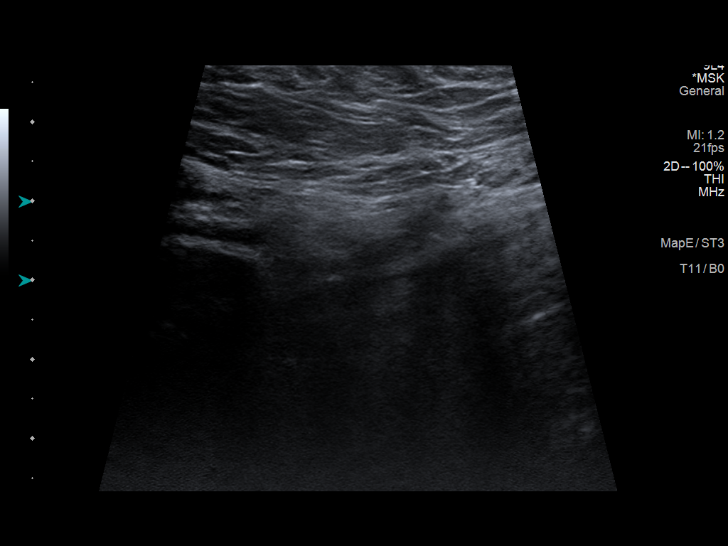
[im 8/11]
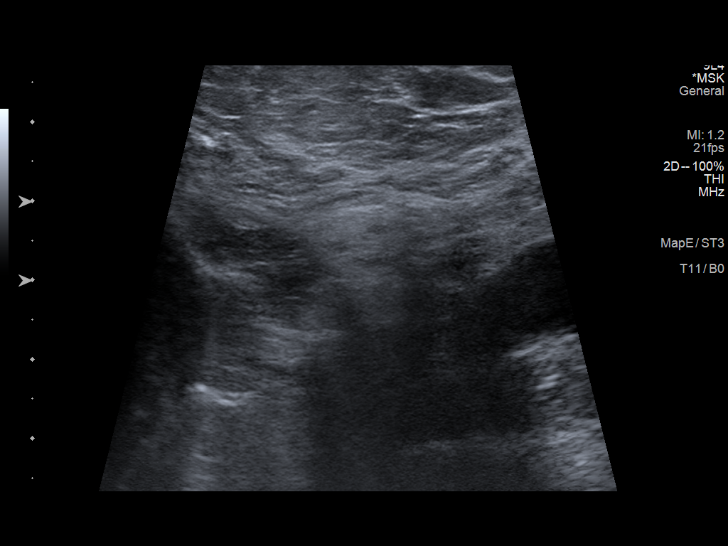
[im 9/11]
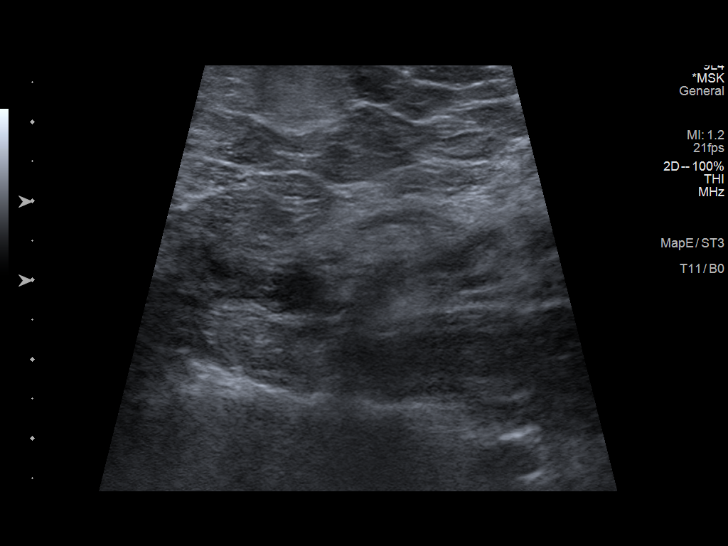
[im 10/11]
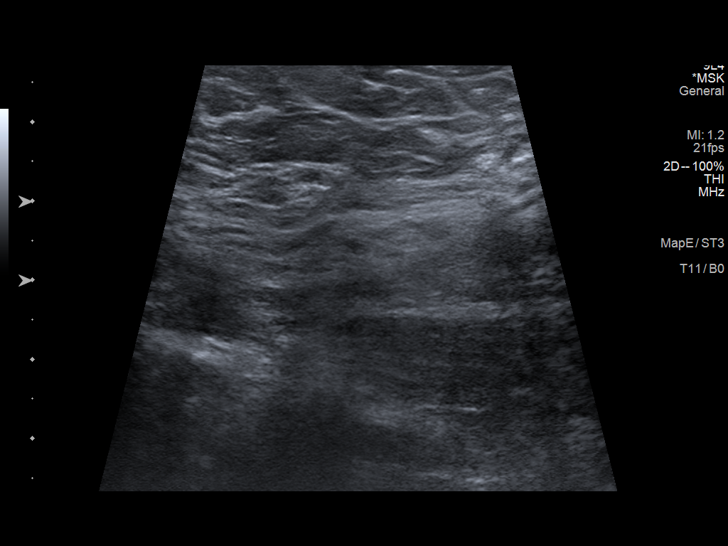
[im 11/11]
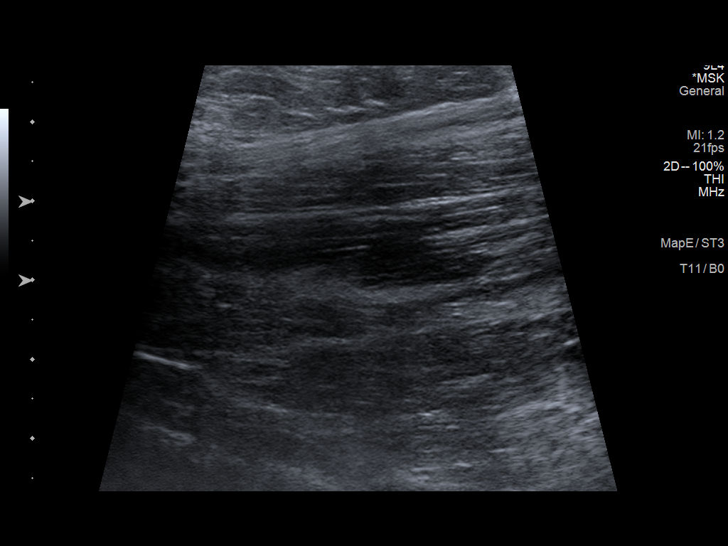

[11 of 11 positions shown; findings below may reference images not displayed]

FINDINGS: Both inguinal regions have normal appearances with no
sonographically visible hernia or mass.
IMPRESSION: Sonographically normal bilateral inguinal regions.
